# Patient Record
Sex: Female | Born: 1960 | Race: Black or African American | Hispanic: No | Marital: Married | Smoking: Never smoker
Health system: Southern US, Community
[De-identification: ages and names within clinical notes are randomized; demographics above are authoritative.]

## PROBLEM LIST (undated history)

## (undated) DIAGNOSIS — R42 Dizziness and giddiness: Secondary | ICD-10-CM

## (undated) DIAGNOSIS — M2550 Pain in unspecified joint: Secondary | ICD-10-CM

## (undated) DIAGNOSIS — Z889 Allergy status to unspecified drugs, medicaments and biological substances status: Secondary | ICD-10-CM

## (undated) DIAGNOSIS — K219 Gastro-esophageal reflux disease without esophagitis: Secondary | ICD-10-CM

## (undated) DIAGNOSIS — M1990 Unspecified osteoarthritis, unspecified site: Secondary | ICD-10-CM

## (undated) DIAGNOSIS — I10 Essential (primary) hypertension: Secondary | ICD-10-CM

## (undated) DIAGNOSIS — M2540 Effusion, unspecified joint: Secondary | ICD-10-CM

## (undated) DIAGNOSIS — G8929 Other chronic pain: Secondary | ICD-10-CM

## (undated) HISTORY — PX: ABDOMINAL HYSTERECTOMY: SHX81

---

## 2000-03-10 ENCOUNTER — Ambulatory Visit (HOSPITAL_COMMUNITY): Admission: RE | Admit: 2000-03-10 | Discharge: 2000-03-10 | Payer: Self-pay | Admitting: Family Medicine

## 2000-03-10 ENCOUNTER — Encounter: Payer: Self-pay | Admitting: Family Medicine

## 2000-07-20 ENCOUNTER — Encounter (INDEPENDENT_AMBULATORY_CARE_PROVIDER_SITE_OTHER): Payer: Self-pay | Admitting: Specialist

## 2000-07-20 ENCOUNTER — Other Ambulatory Visit: Admission: RE | Admit: 2000-07-20 | Discharge: 2000-07-20 | Payer: Self-pay | Admitting: Obstetrics and Gynecology

## 2000-07-23 ENCOUNTER — Observation Stay (HOSPITAL_COMMUNITY): Admission: RE | Admit: 2000-07-23 | Discharge: 2000-07-24 | Payer: Self-pay | Admitting: Obstetrics and Gynecology

## 2002-01-31 ENCOUNTER — Ambulatory Visit (HOSPITAL_COMMUNITY): Admission: RE | Admit: 2002-01-31 | Discharge: 2002-01-31 | Payer: Self-pay | Admitting: Obstetrics and Gynecology

## 2002-01-31 ENCOUNTER — Encounter: Payer: Self-pay | Admitting: Obstetrics and Gynecology

## 2003-02-08 ENCOUNTER — Ambulatory Visit (HOSPITAL_COMMUNITY): Admission: RE | Admit: 2003-02-08 | Discharge: 2003-02-08 | Payer: Self-pay | Admitting: Obstetrics and Gynecology

## 2003-02-08 ENCOUNTER — Encounter: Payer: Self-pay | Admitting: Obstetrics and Gynecology

## 2004-03-28 ENCOUNTER — Ambulatory Visit (HOSPITAL_COMMUNITY): Admission: RE | Admit: 2004-03-28 | Discharge: 2004-03-28 | Payer: Self-pay | Admitting: Infectious Diseases

## 2006-04-14 ENCOUNTER — Other Ambulatory Visit: Admission: RE | Admit: 2006-04-14 | Discharge: 2006-04-14 | Payer: Self-pay | Admitting: Obstetrics and Gynecology

## 2008-04-03 ENCOUNTER — Ambulatory Visit (HOSPITAL_COMMUNITY): Admission: RE | Admit: 2008-04-03 | Discharge: 2008-04-03 | Payer: Self-pay | Admitting: Obstetrics and Gynecology

## 2009-02-05 ENCOUNTER — Encounter (INDEPENDENT_AMBULATORY_CARE_PROVIDER_SITE_OTHER): Payer: Self-pay | Admitting: Internal Medicine

## 2009-02-05 ENCOUNTER — Observation Stay (HOSPITAL_COMMUNITY): Admission: EM | Admit: 2009-02-05 | Discharge: 2009-02-05 | Payer: Self-pay | Admitting: Emergency Medicine

## 2009-12-21 HISTORY — PX: FOOT SURGERY: SHX648

## 2010-01-28 ENCOUNTER — Ambulatory Visit (HOSPITAL_COMMUNITY): Admission: RE | Admit: 2010-01-28 | Discharge: 2010-01-28 | Payer: Self-pay | Admitting: Obstetrics and Gynecology

## 2011-04-07 LAB — CARDIAC PANEL(CRET KIN+CKTOT+MB+TROPI)
CK, MB: 0.9 ng/mL (ref 0.3–4.0)
Relative Index: 0.8 (ref 0.0–2.5)
Total CK: 106 U/L (ref 7–177)
Troponin I: 0.02 ng/mL (ref 0.00–0.06)

## 2011-04-07 LAB — CK TOTAL AND CKMB (NOT AT ARMC): Total CK: 108 U/L (ref 7–177)

## 2011-04-07 LAB — CBC
HCT: 38.2 % (ref 36.0–46.0)
Hemoglobin: 13 g/dL (ref 12.0–15.0)
MCHC: 34.1 g/dL (ref 30.0–36.0)
MCHC: 34.5 g/dL (ref 30.0–36.0)
MCV: 88.7 fL (ref 78.0–100.0)
MCV: 89.3 fL (ref 78.0–100.0)
Platelets: 171 10*3/uL (ref 150–400)
Platelets: 172 10*3/uL (ref 150–400)
RBC: 4.16 MIL/uL (ref 3.87–5.11)
RDW: 13.6 % (ref 11.5–15.5)
RDW: 13.9 % (ref 11.5–15.5)

## 2011-04-07 LAB — LIPID PANEL
Cholesterol: 183 mg/dL (ref 0–200)
HDL: 50 mg/dL (ref >39)
LDL Cholesterol: 126 mg/dL — ABNORMAL HIGH (ref 0–99)
Total CHOL/HDL Ratio: 3.7 RATIO

## 2011-04-07 LAB — POCT I-STAT, CHEM 8
Calcium, Ion: 1.25 mmol/L (ref 1.12–1.32)
Glucose, Bld: 139 mg/dL — ABNORMAL HIGH (ref 70–99)
HCT: 42 % (ref 36.0–46.0)
Hemoglobin: 14.3 g/dL (ref 12.0–15.0)
TCO2: 24 mmol/L (ref 0–100)

## 2011-04-07 LAB — HEMOGLOBIN A1C
Hgb A1c MFr Bld: 5.8 % (ref 4.6–6.1)
Mean Plasma Glucose: 120 mg/dL

## 2011-04-07 LAB — COMPREHENSIVE METABOLIC PANEL
ALT: 17 U/L (ref 0–35)
Alkaline Phosphatase: 62 U/L (ref 39–117)
BUN: 9 mg/dL (ref 6–23)
CO2: 24 mEq/L (ref 19–32)
Calcium: 9.6 mg/dL (ref 8.4–10.5)
GFR calc non Af Amer: >60 mL/min (ref >60)
Glucose, Bld: 102 mg/dL — ABNORMAL HIGH (ref 70–99)
Sodium: 137 mEq/L (ref 135–145)

## 2011-04-07 LAB — URINALYSIS, ROUTINE W REFLEX MICROSCOPIC
Glucose, UA: NEGATIVE mg/dL
Hgb urine dipstick: NEGATIVE
Specific Gravity, Urine: 1.006 (ref 1.005–1.030)

## 2011-04-07 LAB — POCT CARDIAC MARKERS

## 2011-04-07 LAB — PROTIME-INR: INR: 1 (ref 0.00–1.49)

## 2011-05-05 NOTE — H&P (Signed)
Kathryn Contreras, Kathryn Contreras NO.:  [id number]   MEDICAL RECORD NO.:  [id number]          PATIENT TYPE:  OBV   LOCATION:  4731                         FACILITY:  [facility]   PHYSICIAN:  Michiel Cowboy, MDDATE OF BIRTH:  23-Oct-1961   DATE OF ADMISSION:  02/05/2009  DATE OF DISCHARGE:                              HISTORY & PHYSICAL   PRIMARY CARE Malosi Hemstreet:  Holley Bouche, M.D.   CHIEF COMPLAINT:  Chest pain.   HISTORY OF PRESENT ILLNESS:  The patient is a 50 year old female with  past medical history significant for hypertension.  She presents with a  chief complaint of chest pain for about a week or so.  It has been  progressive in frequency.  It seems to be nonexertional; occurs at rest.  Sometimes feels burning, sometimes more like a pressure in the center of  her chest.  Today, she developed it while at rest with some nausea and  vomiting associated with it, as well as presyncopal sensations which  scared her enough to come to the emergency department.  She stated that  the chest pain may have gone on for a few hours.  There seems to be not  much relief with nitroglycerin.  She is interested in getting a stress  test done with a cardiologist tomorrow, but not sure if she can get an  appointment.  She does not have a cardiologist physician.   PAST MEDICAL HISTORY:  Hypertension.   SOCIAL HISTORY:  The patient does not smoke and never did.  Does not use  drugs.  Does not drink.  Has a husband who is caring.  Has a fairly  stressful job and endorses increased stress on her job recently.   FAMILY HISTORY:  Significant for father with prolapsed bowels status  post repair, but no history of coronary artery disease.   ALLERGIES:  No known drug allergies.   MEDICATIONS:  Bisoprolol/hydrochlorothiazide __________/6.25 mg daily.   PHYSICAL EXAMINATION:  VITAL SIGNS:  Temperature 98.2, blood pressure  167/84.  Per the patient, this has recently been elevated; usually  it is  much more under control.  Pulse 85, respirations 12, satting 100% on  room air.  GENERAL:  The patient appears to be in no acute distress.  HEENT:  Head nontraumatic.  Moist mucous membranes.  LUNGS:  Clear to auscultation bilaterally.  HEART:  Regular rate and rhythm.  No murmurs, rubs or gallops.  ABDOMEN:  Soft, nontender, nondistended.  LOWER EXTREMITIES:  Without clubbing, cyanosis or edema.  NEUROLOGIC:  The patient appears intact.   REVIEW OF SYSTEMS:  Negative except for per HPI.   LABORATORY DATA:  White blood cell count 10.9, hemoglobin 13.0,  creatinine 1.0, potassium 3.4, sodium 141.   ELECTROCARDIOGRAM:  EKG showing sinus tachycardia, heart rate at 101.  There is mild T-wave flattening in leads V2, V4 and V5 with T-wave  inversion in V3.  No old EKG available for comparison.   CHEST X-RAY:  No active cardiopulmonary disease.   ASSESSMENT/PLAN:  This is a 50 year old female with chest pain with  atypical features being admitted for rule  out.  1. Chest pain.  We will cycle cardiac enzymes, check fasting lipid      panel, hemoglobin A1c.  Check TSH.  Will do serial EKGs.  Since the      patient had persistent pain for about a week, she will need a      stress test.  Consider calling cardiology to have them arrange that      in the near future.  We will also get a GI cocktail to see if that      helps with her chest pain that sometimes has a burning component to      it, and make sure she is on Protonix.  2. Hypertension.  Will continue bisoprolol, increase      hydrochlorothiazide to 25 mg as she is still having continuous      hypertension and see how she does.  3. Hypopotassemia.  Will replace.  4. Prophylaxis.  Lovenox plus Protonix.      Michiel Cowboy, MD  Electronically Signed     AVD/MEDQ  D:  02/05/2009  T:  02/05/2009  Job:  [id number]   cc:   Holley Bouche, M.D.

## 2011-05-08 NOTE — H&P (Signed)
[facility]  Patient:    Kathryn Contreras, Kathryn Contreras                       MRN: [id number] Adm. Date:  07/23/00 Attending:  Janine Limbo, M.D. CC:         Arvella Merles, M.D.                         History and Physical  HISTORY OF PRESENT ILLNESS:       [name] is a 50 year old female, para 3-0-0-3, who presents for a vaginal hysterectomy because of fibroids, menorrhagia, and dysmenorrhea.  An endometrial biopsy showed benign endometrium.  Her last Pap smear was in December 2000, and it was within normal limits.  An ultrasound was performed which showed an 11 cm x 7.1 cm uterus, and fibroids were noted with the largest measuring 4.7 cm.  The ovaries appeared normal.  The patient is currently taking birth control pills, and this has not relieved her discomfort.  She is ready to proceed with definitive therapy.  DRUG ALLERGIES:                   None.  PAST MEDICAL HISTORY:             The patient has a history of hypertension, and she is currently taking Ziac 5 mg each day in addition to her Loestrin 120 each day.  REVIEW OF SYSTEMS:                Noncontributory.  FAMILY HISTORY:                   The patient has a family history of hypertension in both her parents, and she had a paternal aunt who had asthma and breast cancer.  SOCIAL HISTORY:                   The patient is married, and she works for [employer].  She denies cigarette use.  PHYSICAL EXAMINATION:  VITAL SIGNS:                      Blood pressure 118/84.  On her last visit, weight is 200 pounds.  HEENT:                            Within normal limits.  CHEST:                            Clear.  HEART:                            Regular rate and rhythm.  BREASTS:                          Without masses.  ABDOMEN:                          Nontender.  EXTREMITIES:                      Within normal limits.  NEUROLOGIC:                       Normal  exam.  PELVIC:                           External genitalia is normal.  Vagina is normal except for pelvic relaxation.  The cervix is nontender.  The uterus is 8 to 10-week size, irregular, and nontender.  Adnexa: No masses.  Rectovaginal exam confirms.  ASSESSMENT:                       1. Fibroid uterus.                                   2. Menorrhagia.                                   3. Dysmenorrhea.  PLAN:                             The patient will undergo a vaginal hysterectomy.  She understands the indications for her procedure as well as the alternative treatment.  She accepts the risks of, but not limited to, anesthetic complications, bleeding, infection, and possible damage to the surrounding organs. DD:  07/22/00 TD:  07/22/00 Job: [id number]

## 2011-05-08 NOTE — Op Note (Signed)
[facility]  Patient:    Kathryn Contreras, Kathryn Contreras                     MRN: [id number] Proc. Date: 07/23/00 Adm. Date:  [id number] Attending:  Leonard Schwartz CC:         Arvella Merles, M.D.                           Operative Report  PREOPERATIVE DIAGNOSES:           1. 10-week size fibroid uterus.                                   2. Menorrhagia.                                   3. Dysmenorrhea.                                   4. Anemia (hemoglobin 11.7).  POSTOPERATIVE DIAGNOSES:          1. 10-week size fibroid uterus.                                   2. Menorrhagia.                                   3. Dysmenorrhea.                                   4. Anemia (hemoglobin 11.7).  OPERATION:                        Vaginal hysterectomy.  SURGEON:                          Janine Limbo, M.D.  ASSISTANT:                        Belva Bertin, PA-C.  ANESTHESIA:                       General.  DISPOSITION:                      The patient is a 50 year old female with the above-mentioned diagnoses.  She understands the indications for her procedure, and she accepts the risks of, but not limited to, anesthetic complications, bleeding, infection, and possible damage to the surrounding organs.  FINDINGS:                         A 10-12 week size fibroid uterus was present.  The fallopian tubes and ovaries appeared normal.  DESCRIPTION OF PROCEDURE:         The patient was taken to the operating room, where a general anesthetic was given.  The patients lower abdomen, perineum and vagina were prepped with multiple layers of Betadine.  A  Foley catheter was placed into the bladder.  The patient was sterilely draped.  Examination under anesthesia was performed.  The cervix was injected with a diluted solution of ______ and saline.                                    A circumferential incision was made around the cervix and the anterior, then  posterior cul-de-sac was sharply entered. Alternating from right to left, the uterosacral ligament; paracervical tissues; parametrial tissues; uterine arteries; and upper pedicles were clamped, cut, suture tie-off, and tied securely.  The uterus was elevated through the posterior corpotomy.  The remainder of the upper pedicles were clamped and cut; the uterus was removed from the operative field.                                    The upper pedicles were secured using three ties, and then suture ligatures.  Hemostasis was achieved using figure-of-eight sutures.  The sutures attached to the uterosacral ligaments were brought out through the vaginal angle, then tied securely. Hemacolcotoplasty suture was placed in the posterior cul-de-sac, incorporating the uterosacral ligaments and the posterior peritoneal.  A final check was made for hemostasis, and hemostasis was noted to be adequate.  The vaginal cuff was closed using figure-of-eight sutures.  Sponge, needle and instrument counts were correct on two occasions.  ESTIMATED BLOOD LOSS:             250 cc.  FINAL DISPOSITION:                The patient tolerated the procedure well. Suture material used throughout was 0 Vicryl.  There was 900 cc of urine output during the procedure; urine was clear.  The patient was awakened from her anesthetic and taken to the recovery room in stable condition. DD:  07/23/00 TD:  07/26/00 Job: [id number]

## 2011-05-11 ENCOUNTER — Other Ambulatory Visit (HOSPITAL_COMMUNITY): Payer: Self-pay | Admitting: Obstetrics and Gynecology

## 2011-05-11 DIAGNOSIS — Z1231 Encounter for screening mammogram for malignant neoplasm of breast: Secondary | ICD-10-CM

## 2011-05-26 ENCOUNTER — Ambulatory Visit (HOSPITAL_COMMUNITY)
Admission: RE | Admit: 2011-05-26 | Discharge: 2011-05-26 | Disposition: A | Discharge status: Home / Self Care | Payer: 59 | Source: Ambulatory Visit | Attending: Obstetrics and Gynecology | Admitting: Obstetrics and Gynecology

## 2011-05-26 DIAGNOSIS — Z1231 Encounter for screening mammogram for malignant neoplasm of breast: Secondary | ICD-10-CM

## 2011-06-09 ENCOUNTER — Ambulatory Visit (HOSPITAL_COMMUNITY)
Admission: RE | Admit: 2011-06-09 | Discharge: 2011-06-09 | Disposition: A | Discharge status: Home / Self Care | Payer: 59 | Source: Ambulatory Visit | Attending: Obstetrics and Gynecology | Admitting: Obstetrics and Gynecology

## 2011-06-09 DIAGNOSIS — Z1231 Encounter for screening mammogram for malignant neoplasm of breast: Secondary | ICD-10-CM | POA: Insufficient documentation

## 2012-03-01 ENCOUNTER — Other Ambulatory Visit: Payer: Self-pay | Admitting: Family Medicine

## 2012-03-01 DIAGNOSIS — M545 Low back pain: Secondary | ICD-10-CM

## 2012-03-06 ENCOUNTER — Ambulatory Visit
Admission: RE | Admit: 2012-03-06 | Discharge: 2012-03-06 | Disposition: A | Discharge status: Home / Self Care | Payer: 59 | Source: Ambulatory Visit | Attending: Family Medicine | Admitting: Family Medicine

## 2012-03-06 DIAGNOSIS — M545 Low back pain: Secondary | ICD-10-CM

## 2012-03-22 ENCOUNTER — Other Ambulatory Visit: Payer: Self-pay | Admitting: Neurosurgery

## 2012-04-01 ENCOUNTER — Encounter (HOSPITAL_COMMUNITY): Payer: Self-pay | Admitting: Pharmacy Technician

## 2012-04-06 ENCOUNTER — Encounter (HOSPITAL_COMMUNITY): Payer: Self-pay

## 2012-04-06 ENCOUNTER — Encounter (HOSPITAL_COMMUNITY)
Admission: RE | Admit: 2012-04-06 | Discharge: 2012-04-06 | Disposition: A | Discharge status: Home / Self Care | Payer: 59 | Source: Ambulatory Visit | Attending: Anesthesiology | Admitting: Anesthesiology

## 2012-04-06 ENCOUNTER — Encounter (HOSPITAL_COMMUNITY)
Admission: RE | Admit: 2012-04-06 | Discharge: 2012-04-06 | Disposition: A | Discharge status: Home / Self Care | Payer: 59 | Source: Ambulatory Visit | Attending: Neurosurgery | Admitting: Neurosurgery

## 2012-04-06 HISTORY — DX: Other chronic pain: G89.29

## 2012-04-06 HISTORY — DX: Effusion, unspecified joint: M25.40

## 2012-04-06 HISTORY — DX: Dizziness and giddiness: R42

## 2012-04-06 HISTORY — DX: Allergy status to unspecified drugs, medicaments and biological substances: Z88.9

## 2012-04-06 HISTORY — DX: Essential (primary) hypertension: I10

## 2012-04-06 HISTORY — DX: Gastro-esophageal reflux disease without esophagitis: K21.9

## 2012-04-06 HISTORY — DX: Unspecified osteoarthritis, unspecified site: M19.90

## 2012-04-06 HISTORY — DX: Pain in unspecified joint: M25.50

## 2012-04-06 LAB — CBC
MCH: 29.6 pg (ref 26.0–34.0)
MCHC: 34.2 g/dL (ref 30.0–36.0)
RDW: 14 % (ref 11.5–15.5)

## 2012-04-06 LAB — SURGICAL PCR SCREEN
MRSA, PCR: NEGATIVE
Staphylococcus aureus: NEGATIVE

## 2012-04-06 LAB — BASIC METABOLIC PANEL
BUN: 13 mg/dL (ref 6–23)
Creatinine, Ser: 0.88 mg/dL (ref 0.50–1.10)
GFR calc Af Amer: 87 mL/min — ABNORMAL LOW (ref >90)
GFR calc non Af Amer: 75 mL/min — ABNORMAL LOW (ref >90)
Glucose, Bld: 82 mg/dL (ref 70–99)

## 2012-04-06 NOTE — Progress Notes (Addendum)
Pt doesn't have a cardiologist  Echo in epic from 2010 Stress test done in 2010-to request report from [facility]  Denies heart cath  Medical MD is Dr.William Tiburcio Pea with [facility] on [facility] HTN

## 2012-04-06 NOTE — Pre-Procedure Instructions (Signed)
20 Kathryn Contreras  04/06/2012   Your procedure is scheduled on:  Thurs, April 25 @ 0730  Report to [facility] at 0530 AM.  Call this number if you have problems the morning of surgery: [phone]   Remember:   Do not eat food:After Midnight.  May have clear liquids: up to 4 Hours before arrival.(until 1:30 am)  Clear liquids include soda, tea, black coffee, apple or grape juice, broth,water  Take these medicines the morning of surgery with A SIP OF WATER: Pain Pill(if needed)   Do not wear jewelry, make-up or nail polish.  Do not wear lotions, powders, or perfumes.   Do not shave 48 hours prior to surgery.  Do not bring valuables to the hospital.  Contacts, dentures or bridgework may not be worn into surgery.  Leave suitcase in the car. After surgery it may be brought to your room.  For patients admitted to the hospital, checkout time is 11:00 AM the day of discharge.   Patients discharged the day of surgery will not be allowed to drive home.  Special Instructions: CHG Shower Use Special Wash: 1/2 bottle night before surgery and 1/2 bottle morning of surgery.   Please read over the following fact sheets that you were given: Pain Booklet, Coughing and Deep Breathing, MRSA Information and Surgical Site Infection Prevention

## 2012-04-07 NOTE — Consult Note (Signed)
Anesthesia Chart Review:  Patient is a 51 year old female scheduled for right L4 laminectomy on 04/14/12.  History includes non-smoker, HTN, GERD, dizziness, chronic pain, obesity with BMI 35.  PCP is Dr. Johny Blamer [facility]).  Labs and CXR acceptable.  Echo from 02/05/09 showed: - Overall left ventricular systolic function was normal. Left ventricular ejection fraction was estimated to be 60 %. There were no left ventricular regional wall motion abnormalities. Left ventricular diastolic function parameters were normal. - The mean transaortic valve gradient was 6 mmHg.  She had a stress test at [facility] on 02/08/09 (for "atypicl angina" that showed normal perfusion, EF 74%.  It was felt to be a low risk scan.  No specific follow-up was recommended unless she had recurrent symptoms.  Her EKG then showed SR, poor r wave progression, consider old anterior infarct, non-specific T wave abnormality.  EKG from 04/06/12 showed NSR with sinus arrhythmia, cannot rule out anterior infarct (age undetermined).  No CV symptoms were documented from her PAT visit.  If remains asymptomatic, then anticipate she can proceed as planned.

## 2012-04-13 MED ORDER — CEFAZOLIN SODIUM-DEXTROSE 2-3 GM-% IV SOLR
2.0000 g | INTRAVENOUS | Status: AC
  Administered 2012-04-14: 2 g via INTRAVENOUS
  Filled 2012-04-13: qty 50

## 2012-04-14 ENCOUNTER — Encounter (HOSPITAL_COMMUNITY): Payer: Self-pay | Admitting: *Deleted

## 2012-04-14 ENCOUNTER — Observation Stay (HOSPITAL_COMMUNITY)
Admission: RE | Admit: 2012-04-14 | Discharge: 2012-04-15 | Disposition: A | Discharge status: Home / Self Care | Payer: 59 | Source: Ambulatory Visit | Attending: Neurosurgery | Admitting: Neurosurgery

## 2012-04-14 ENCOUNTER — Encounter (HOSPITAL_COMMUNITY)
Admission: RE | Disposition: A | Discharge status: Home / Self Care | Payer: Self-pay | Source: Ambulatory Visit | Attending: Neurosurgery

## 2012-04-14 ENCOUNTER — Observation Stay (HOSPITAL_COMMUNITY): Payer: 59

## 2012-04-14 ENCOUNTER — Encounter (HOSPITAL_COMMUNITY): Payer: Self-pay | Admitting: Anesthesiology

## 2012-04-14 ENCOUNTER — Encounter (HOSPITAL_COMMUNITY): Payer: Self-pay | Admitting: Vascular Surgery

## 2012-04-14 ENCOUNTER — Ambulatory Visit (HOSPITAL_COMMUNITY): Payer: 59 | Admitting: Vascular Surgery

## 2012-04-14 DIAGNOSIS — Z01818 Encounter for other preprocedural examination: Secondary | ICD-10-CM | POA: Insufficient documentation

## 2012-04-14 DIAGNOSIS — M7130 Other bursal cyst, unspecified site: Principal | ICD-10-CM | POA: Insufficient documentation

## 2012-04-14 DIAGNOSIS — I10 Essential (primary) hypertension: Secondary | ICD-10-CM | POA: Insufficient documentation

## 2012-04-14 DIAGNOSIS — M47817 Spondylosis without myelopathy or radiculopathy, lumbosacral region: Secondary | ICD-10-CM | POA: Insufficient documentation

## 2012-04-14 DIAGNOSIS — M7138 Other bursal cyst, other site: Secondary | ICD-10-CM

## 2012-04-14 DIAGNOSIS — K219 Gastro-esophageal reflux disease without esophagitis: Secondary | ICD-10-CM | POA: Insufficient documentation

## 2012-04-14 DIAGNOSIS — Z01812 Encounter for preprocedural laboratory examination: Secondary | ICD-10-CM | POA: Insufficient documentation

## 2012-04-14 DIAGNOSIS — Z01810 Encounter for preprocedural cardiovascular examination: Secondary | ICD-10-CM | POA: Insufficient documentation

## 2012-04-14 HISTORY — PX: LUMBAR LAMINECTOMY/DECOMPRESSION MICRODISCECTOMY: SHX5026

## 2012-04-14 SURGERY — LUMBAR LAMINECTOMY/DECOMPRESSION MICRODISCECTOMY 1 LEVEL
Anesthesia: General | Site: Spine Lumbar | Laterality: Right | Wound class: Clean

## 2012-04-14 MED ORDER — HYDROMORPHONE HCL PF 1 MG/ML IJ SOLN
0.2500 mg | INTRAMUSCULAR | Status: DC | PRN
  Administered 2012-04-14: 0.5 mg via INTRAVENOUS
  Administered 2012-04-14 (×2): 0.25 mg via INTRAVENOUS

## 2012-04-14 MED ORDER — BACITRACIN ZINC 500 UNIT/GM EX OINT
TOPICAL_OINTMENT | CUTANEOUS | Status: DC | PRN
  Administered 2012-04-14: 1 via TOPICAL

## 2012-04-14 MED ORDER — BUPIVACAINE-EPINEPHRINE PF 0.5-1:200000 % IJ SOLN
INTRAMUSCULAR | Status: DC | PRN
  Administered 2012-04-14: 10 mL

## 2012-04-14 MED ORDER — ONDANSETRON HCL 4 MG/2ML IJ SOLN
INTRAMUSCULAR | Status: DC | PRN
  Administered 2012-04-14: 4 mg via INTRAVENOUS

## 2012-04-14 MED ORDER — ZOLPIDEM TARTRATE 5 MG PO TABS: 10.0000 mg | ORAL_TABLET | Freq: Every evening | ORAL | Status: DC | PRN

## 2012-04-14 MED ORDER — LOSARTAN POTASSIUM 50 MG PO TABS
100.0000 mg | ORAL_TABLET | Freq: Every day | ORAL | Status: DC
  Administered 2012-04-14 – 2012-04-15 (×2): 100 mg via ORAL
  Filled 2012-04-14 (×2): qty 2

## 2012-04-14 MED ORDER — MIDAZOLAM HCL 5 MG/5ML IJ SOLN
INTRAMUSCULAR | Status: DC | PRN
  Administered 2012-04-14: 2 mg via INTRAVENOUS

## 2012-04-14 MED ORDER — BUPIVACAINE LIPOSOME 1.3 % IJ SUSP
20.0000 mL | INTRAMUSCULAR | Status: AC
Start: 2012-04-14 — End: 2012-04-15
  Filled 2012-04-14: qty 20

## 2012-04-14 MED ORDER — FENTANYL CITRATE 0.05 MG/ML IJ SOLN
INTRAMUSCULAR | Status: DC | PRN
  Administered 2012-04-14 (×2): 100 ug via INTRAVENOUS
  Administered 2012-04-14: 50 ug via INTRAVENOUS

## 2012-04-14 MED ORDER — ONDANSETRON HCL 4 MG/2ML IJ SOLN: 4.0000 mg | INTRAMUSCULAR | Status: DC | PRN

## 2012-04-14 MED ORDER — SODIUM CHLORIDE 0.9 % IV SOLN
INTRAVENOUS | Status: AC
  Filled 2012-04-14: qty 500

## 2012-04-14 MED ORDER — 0.9 % SODIUM CHLORIDE (POUR BTL) OPTIME
TOPICAL | Status: DC | PRN
  Administered 2012-04-14: 1000 mL

## 2012-04-14 MED ORDER — CEFAZOLIN SODIUM-DEXTROSE 2-3 GM-% IV SOLR
2.0000 g | Freq: Three times a day (TID) | INTRAVENOUS | Status: AC
  Administered 2012-04-14 – 2012-04-15 (×2): 2 g via INTRAVENOUS
  Filled 2012-04-14 (×2): qty 50

## 2012-04-14 MED ORDER — HYDROMORPHONE HCL PF 1 MG/ML IJ SOLN
INTRAMUSCULAR | Status: AC
  Filled 2012-04-14: qty 1

## 2012-04-14 MED ORDER — HYDROCODONE-ACETAMINOPHEN 5-325 MG PO TABS: 1.0000 | ORAL_TABLET | ORAL | Status: DC | PRN

## 2012-04-14 MED ORDER — MORPHINE SULFATE 4 MG/ML IJ SOLN: 0.0500 mg/kg | INTRAMUSCULAR | Status: DC | PRN

## 2012-04-14 MED ORDER — PHENOL 1.4 % MT LIQD: 1.0000 | OROMUCOSAL | Status: DC | PRN

## 2012-04-14 MED ORDER — THROMBIN 5000 UNITS EX KIT
PACK | CUTANEOUS | Status: DC | PRN
  Administered 2012-04-14 (×2): 5000 [IU] via TOPICAL

## 2012-04-14 MED ORDER — DEXAMETHASONE SODIUM PHOSPHATE 4 MG/ML IJ SOLN
INTRAMUSCULAR | Status: DC | PRN
  Administered 2012-04-14: 8 mg via INTRAVENOUS

## 2012-04-14 MED ORDER — GLYCOPYRROLATE 0.2 MG/ML IJ SOLN
INTRAMUSCULAR | Status: DC | PRN
  Administered 2012-04-14: 0.6 mg via INTRAVENOUS

## 2012-04-14 MED ORDER — MORPHINE SULFATE 4 MG/ML IJ SOLN
1.0000 mg | INTRAMUSCULAR | Status: DC | PRN
  Administered 2012-04-14: 4 mg via INTRAVENOUS
  Filled 2012-04-14: qty 1

## 2012-04-14 MED ORDER — PROPOFOL 10 MG/ML IV EMUL
INTRAVENOUS | Status: DC | PRN
  Administered 2012-04-14: 250 mg via INTRAVENOUS

## 2012-04-14 MED ORDER — SODIUM CHLORIDE 0.9 % IR SOLN
Status: DC | PRN
  Administered 2012-04-14: 09:00:00

## 2012-04-14 MED ORDER — ONDANSETRON HCL 4 MG/2ML IJ SOLN: 4.0000 mg | Freq: Once | INTRAMUSCULAR | Status: DC | PRN

## 2012-04-14 MED ORDER — HEMOSTATIC AGENTS (NO CHARGE) OPTIME
TOPICAL | Status: DC | PRN
  Administered 2012-04-14: 1 via TOPICAL

## 2012-04-14 MED ORDER — LIDOCAINE HCL (CARDIAC) 20 MG/ML IV SOLN
INTRAVENOUS | Status: DC | PRN
  Administered 2012-04-14: 100 mg via INTRAVENOUS

## 2012-04-14 MED ORDER — DIAZEPAM 5 MG PO TABS
5.0000 mg | ORAL_TABLET | Freq: Four times a day (QID) | ORAL | Status: DC | PRN
  Administered 2012-04-14 – 2012-04-15 (×3): 5 mg via ORAL
  Filled 2012-04-14 (×3): qty 1

## 2012-04-14 MED ORDER — LOSARTAN POTASSIUM-HCTZ 100-12.5 MG PO TABS: 1.0000 | ORAL_TABLET | Freq: Every day | ORAL | Status: DC

## 2012-04-14 MED ORDER — ACETAMINOPHEN 650 MG RE SUPP: 650.0000 mg | RECTAL | Status: DC | PRN

## 2012-04-14 MED ORDER — LACTATED RINGERS IV SOLN
INTRAVENOUS | Status: DC | PRN
  Administered 2012-04-14 (×2): via INTRAVENOUS

## 2012-04-14 MED ORDER — MORPHINE SULFATE 2 MG/ML IJ SOLN: 0.0500 mg/kg | INTRAMUSCULAR | Status: DC | PRN

## 2012-04-14 MED ORDER — BACITRACIN 50000 UNITS IM SOLR
INTRAMUSCULAR | Status: AC
  Filled 2012-04-14: qty 1

## 2012-04-14 MED ORDER — ADULT MULTIVITAMIN W/MINERALS CH
1.0000 | ORAL_TABLET | Freq: Every day | ORAL | Status: DC
  Administered 2012-04-14 – 2012-04-15 (×2): 1 via ORAL
  Filled 2012-04-14 (×2): qty 1

## 2012-04-14 MED ORDER — ETODOLAC 400 MG PO TABS
400.0000 mg | ORAL_TABLET | Freq: Two times a day (BID) | ORAL | Status: DC | PRN
  Administered 2012-04-14: 400 mg via ORAL
  Filled 2012-04-14 (×2): qty 1

## 2012-04-14 MED ORDER — LACTATED RINGERS IV SOLN: INTRAVENOUS | Status: DC

## 2012-04-14 MED ORDER — HYDROCHLOROTHIAZIDE 12.5 MG PO CAPS
12.5000 mg | ORAL_CAPSULE | Freq: Every day | ORAL | Status: DC
  Administered 2012-04-14 – 2012-04-15 (×2): 12.5 mg via ORAL
  Filled 2012-04-14 (×2): qty 1

## 2012-04-14 MED ORDER — OXYCODONE-ACETAMINOPHEN 5-325 MG PO TABS
1.0000 | ORAL_TABLET | ORAL | Status: DC | PRN
  Administered 2012-04-14: 2 via ORAL
  Administered 2012-04-14: 1 via ORAL
  Administered 2012-04-15: 2 via ORAL
  Filled 2012-04-14: qty 1
  Filled 2012-04-14 (×2): qty 2

## 2012-04-14 MED ORDER — NEOSTIGMINE METHYLSULFATE 1 MG/ML IJ SOLN
INTRAMUSCULAR | Status: DC | PRN
  Administered 2012-04-14: 4 mg via INTRAVENOUS

## 2012-04-14 MED ORDER — ROCURONIUM BROMIDE 100 MG/10ML IV SOLN
INTRAVENOUS | Status: DC | PRN
  Administered 2012-04-14: 50 mg via INTRAVENOUS

## 2012-04-14 MED ORDER — MENTHOL 3 MG MT LOZG: 1.0000 | LOZENGE | OROMUCOSAL | Status: DC | PRN

## 2012-04-14 MED ORDER — ACETAMINOPHEN 10 MG/ML IV SOLN
INTRAVENOUS | Status: DC | PRN
  Administered 2012-04-14: 1000 mg via INTRAVENOUS

## 2012-04-14 MED ORDER — ACETAMINOPHEN 10 MG/ML IV SOLN
INTRAVENOUS | Status: AC
  Filled 2012-04-14: qty 100

## 2012-04-14 MED ORDER — ACETAMINOPHEN 325 MG PO TABS: 650.0000 mg | ORAL_TABLET | ORAL | Status: DC | PRN

## 2012-04-14 MED ORDER — DOCUSATE SODIUM 100 MG PO CAPS
100.0000 mg | ORAL_CAPSULE | Freq: Two times a day (BID) | ORAL | Status: DC
  Administered 2012-04-14 – 2012-04-15 (×2): 100 mg via ORAL
  Filled 2012-04-14 (×2): qty 1

## 2012-04-14 MED ORDER — DEXTROSE 5 % IV SOLN
INTRAVENOUS | Status: DC | PRN
  Administered 2012-04-14 (×2): via INTRAVENOUS

## 2012-04-14 SURGICAL SUPPLY — 56 items
BAG DECANTER FOR FLEXI CONT (MISCELLANEOUS) ×2 IMPLANT
BENZOIN TINCTURE PRP APPL 2/3 (GAUZE/BANDAGES/DRESSINGS) ×2 IMPLANT
BLADE SURG ROTATE 9660 (MISCELLANEOUS) IMPLANT
BRUSH SCRUB EZ PLAIN DRY (MISCELLANEOUS) ×2 IMPLANT
BUR ACORN 6.0 (BURR) ×2 IMPLANT
BUR MATCHSTICK NEURO 3.0 LAGG (BURR) ×2 IMPLANT
CANISTER SUCTION 2500CC (MISCELLANEOUS) ×2 IMPLANT
CLOTH BEACON ORANGE TIMEOUT ST (SAFETY) ×2 IMPLANT
CONT SPEC 4OZ CLIKSEAL STRL BL (MISCELLANEOUS) ×4 IMPLANT
DRAPE LAPAROTOMY 100X72X124 (DRAPES) ×2 IMPLANT
DRAPE MICROSCOPE LEICA (MISCELLANEOUS) IMPLANT
DRAPE MICROSCOPE ZEISS OPMI (DRAPES) ×2 IMPLANT
DRAPE POUCH INSTRU U-SHP 10X18 (DRAPES) ×2 IMPLANT
DRAPE SURG 17X23 STRL (DRAPES) ×8 IMPLANT
ELECT BLADE 4.0 EZ CLEAN MEGAD (MISCELLANEOUS) ×2
ELECT REM PT RETURN 9FT ADLT (ELECTROSURGICAL) ×2
ELECTRODE BLDE 4.0 EZ CLN MEGD (MISCELLANEOUS) ×1 IMPLANT
ELECTRODE REM PT RTRN 9FT ADLT (ELECTROSURGICAL) ×1 IMPLANT
GAUZE SPONGE 4X4 16PLY XRAY LF (GAUZE/BANDAGES/DRESSINGS) IMPLANT
GLOVE BIO SURGEON STRL SZ8.5 (GLOVE) ×2 IMPLANT
GLOVE BIOGEL PI IND STRL 6.5 (GLOVE) ×1 IMPLANT
GLOVE BIOGEL PI IND STRL 8 (GLOVE) ×2 IMPLANT
GLOVE BIOGEL PI INDICATOR 6.5 (GLOVE) ×1
GLOVE BIOGEL PI INDICATOR 8 (GLOVE) ×2
GLOVE ECLIPSE 7.5 STRL STRAW (GLOVE) ×8 IMPLANT
GLOVE EXAM NITRILE LRG STRL (GLOVE) IMPLANT
GLOVE EXAM NITRILE MD LF STRL (GLOVE) ×2 IMPLANT
GLOVE EXAM NITRILE XL STR (GLOVE) IMPLANT
GLOVE EXAM NITRILE XS STR PU (GLOVE) IMPLANT
GLOVE SS BIOGEL STRL SZ 8 (GLOVE) ×1 IMPLANT
GLOVE SUPERSENSE BIOGEL SZ 8 (GLOVE) ×1
GLOVE SURG SS PI 6.5 STRL IVOR (GLOVE) ×2 IMPLANT
GOWN BRE IMP SLV AUR LG STRL (GOWN DISPOSABLE) ×4 IMPLANT
GOWN BRE IMP SLV AUR XL STRL (GOWN DISPOSABLE) ×2 IMPLANT
GOWN STRL REIN 2XL LVL4 (GOWN DISPOSABLE) ×2 IMPLANT
KIT BASIN OR (CUSTOM PROCEDURE TRAY) ×2 IMPLANT
KIT ROOM TURNOVER OR (KITS) ×2 IMPLANT
NEEDLE HYPO 21X1.5 SAFETY (NEEDLE) ×2 IMPLANT
NEEDLE HYPO 22GX1.5 SAFETY (NEEDLE) ×2 IMPLANT
NS IRRIG 1000ML POUR BTL (IV SOLUTION) ×2 IMPLANT
PACK LAMINECTOMY NEURO (CUSTOM PROCEDURE TRAY) ×2 IMPLANT
PAD ARMBOARD 7.5X6 YLW CONV (MISCELLANEOUS) ×10 IMPLANT
PATTIES SURGICAL .5 X1 (DISPOSABLE) ×2 IMPLANT
RUBBERBAND STERILE (MISCELLANEOUS) ×4 IMPLANT
SPONGE GAUZE 4X4 12PLY (GAUZE/BANDAGES/DRESSINGS) ×2 IMPLANT
SPONGE SURGIFOAM ABS GEL SZ50 (HEMOSTASIS) ×2 IMPLANT
STRIP CLOSURE SKIN 1/2X4 (GAUZE/BANDAGES/DRESSINGS) ×2 IMPLANT
SUT VIC AB 1 CT1 18XBRD ANBCTR (SUTURE) ×1 IMPLANT
SUT VIC AB 1 CT1 8-18 (SUTURE) ×1
SUT VIC AB 2-0 CP2 18 (SUTURE) ×2 IMPLANT
SYR 20CC LL (SYRINGE) ×4 IMPLANT
SYR 20ML ECCENTRIC (SYRINGE) ×2 IMPLANT
TAPE CLOTH SURG 4X10 WHT LF (GAUZE/BANDAGES/DRESSINGS) ×2 IMPLANT
TOWEL OR 17X24 6PK STRL BLUE (TOWEL DISPOSABLE) ×2 IMPLANT
TOWEL OR 17X26 10 PK STRL BLUE (TOWEL DISPOSABLE) ×2 IMPLANT
WATER STERILE IRR 1000ML POUR (IV SOLUTION) ×2 IMPLANT

## 2012-04-14 NOTE — Transfer of Care (Signed)
Immediate Anesthesia Transfer of Care Note  Patient: Kathryn Contreras  Procedure(s) Performed: Procedure(s) (LRB): LUMBAR LAMINECTOMY/DECOMPRESSION MICRODISCECTOMY 1 LEVEL (Right)  Patient Location: PACU  Anesthesia Type: General  Level of Consciousness: awake, alert , oriented and patient cooperative  Airway & Oxygen Therapy: Patient Spontanous Breathing  Post-op Assessment: Report given to PACU RN and Post -op Vital signs reviewed and stable  Post vital signs: Reviewed and stable  Complications: No apparent anesthesia complications

## 2012-04-14 NOTE — Op Note (Signed)
Brief history: The patient is a 51 year old black female who has suffered from back and right leg pain consistent with a lumbar radiculopathy. She has failed medical management and was worked up with a lumbar MRI. This demonstrated a synovial cyst at L4-5 on the right. I discussed the various treatment options with the patient including surgery. The patient has weighed the risks, benefits, and alternatives surgery and decided proceed with a right L4-5 laminectomy for removal of synovial cyst.  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  Surgeon: Dr. Delma Officer  Assistant: Dr. Aliene Beams  Anesthesia: Gen. endotracheal  Estimated blood loss: 25 cc  Specimens: None  Drains: None  Complications: None  Description of procedure: The patient was brought to the operating room by the anesthesia team. General endotracheal anesthesia was induced. The patient was then turned to the prone position on the Wilson frame. Her lumbosacral region was then prepared with Betadine scrub and Betadine solution. Sterile drapes were applied. I then injected the area to be incised with Marcaine with epinephrine solution. I then used a scalpel to make a linear midline incision over the L4-5 interspace. I used electrocautery to perform a right sided subperiosteal dissection exposing the spinous process and lamina of L4 and L5. We then obtained intraoperative radiograph to confirm our location. I then inserted the [facility] retractor for exposure.  We then brought the operative microscope into the field and under its medication and illumination we completed the microdissection/decompression. I used a high-speed drill to perform a right L4 laminotomy. I completed the right L4 hemilaminectomy using  the Kerrison punches. Her remove the L3-4 and L4-5 ligamentum flavum. I used microdissection to free up the thecal sac and the L4 and L5 nerve roots from the epidural tissue. We did encounter a synovial cyst at L4-5 the right as expected. We used microdissection to free up the nerve root from the dura. I then removed the synovial cyst using the Kerrison punches, decompressing the right L4 and L5 nerve roots. I then inspected the L4-5 intervertebral disc. There was no disc herniations. I then palpated along the ventral surface of the thecal sac and along exit route of the right L4 and L5 nerve roots and noted there were well decompressed. I obtained hemostasis using bipolar electrocautery. We then irrigated the wound out with bacitracin solution. Then removed the retractor and reapproximated patient's thoracolumbar fascia with interrupted #1 Vicryl suture. We reapproximated the subcutaneous tissue therapy tubercle suture. And the skin with Steri-Strips and benzoin. We then coated the wound with bacitracin ointment. A sterile dressing was applied the drapes were removed and the patient was subtotally accident by the anesthesia team. She was transported to the post anesthesia care unit in stable condition all sponge instrument and needle counts were correct at the end this case.

## 2012-04-14 NOTE — Anesthesia Procedure Notes (Signed)
Procedure Name: Intubation Date/Time: 04/14/2012 7:46 AM Performed by: Tyrone Nine Pre-anesthesia Checklist: Patient identified, Emergency Drugs available, Suction available, Patient being monitored and Timeout performed Patient Re-evaluated:Patient Re-evaluated prior to inductionOxygen Delivery Method: Circle system utilized Preoxygenation: Pre-oxygenation with 100% oxygen Intubation Type: IV induction Ventilation: Mask ventilation without difficulty Laryngoscope Size: Mac and 3 Grade View: Grade II Tube type: Oral Tube size: 8.0 mm Number of attempts: 1 Airway Equipment and Method: Stylet Placement Confirmation: positive ETCO2,  ETT inserted through vocal cords under direct vision,  breath sounds checked- equal and bilateral and CO2 detector Secured at: 22 cm Tube secured with: Tape Dental Injury: Teeth and Oropharynx as per pre-operative assessment

## 2012-04-14 NOTE — Anesthesia Postprocedure Evaluation (Signed)
  Anesthesia Post-op Note  Patient: Kathryn Contreras  Procedure(s) Performed: Procedure(s) (LRB): LUMBAR LAMINECTOMY/DECOMPRESSION MICRODISCECTOMY 1 LEVEL (Right)  Patient Location: PACU  Anesthesia Type: General  Level of Consciousness: awake, alert  and oriented  Airway and Oxygen Therapy: Patient Spontanous Breathing and Patient connected to nasal cannula oxygen  Post-op Pain: mild  Post-op Assessment: Post-op Vital signs reviewed, Patient's Cardiovascular Status Stable, Respiratory Function Stable, Patent Airway, No signs of Nausea or vomiting and Pain level controlled  Post-op Vital Signs: Reviewed and stable  Complications: No apparent anesthesia complications

## 2012-04-14 NOTE — Anesthesia Preprocedure Evaluation (Addendum)
Anesthesia Evaluation  Patient identified by MRN, date of birth, ID band Patient awake    Reviewed: Allergy & Precautions, H&P , NPO status , Patient's Chart, lab work & pertinent test results, reviewed documented beta blocker date and time   Airway Mallampati: I TM Distance: >3 FB Neck ROM: Full    Dental  (+) Teeth Intact and Dental Advisory Given   Pulmonary neg pulmonary ROS,  breath sounds clear to auscultation  Pulmonary exam normal       Cardiovascular hypertension, Pt. on medications Rhythm:Regular Rate:Normal     Neuro/Psych negative neurological ROS  negative psych ROS   GI/Hepatic GERD-  Controlled and Medicated,  Endo/Other  negative endocrine ROS  Renal/GU      Musculoskeletal negative musculoskeletal ROS (+)   Abdominal   Peds  Hematology negative hematology ROS (+)   Anesthesia Other Findings   Reproductive/Obstetrics negative OB ROS                         Anesthesia Physical Anesthesia Plan  ASA: II  Anesthesia Plan: General   Post-op Pain Management:    Induction: Intravenous  Airway Management Planned: Oral ETT  Additional Equipment:   Intra-op Plan:   Post-operative Plan: Extubation in OR  Informed Consent: I have reviewed the patients History and Physical, chart, labs and discussed the procedure including the risks, benefits and alternatives for the proposed anesthesia with the patient or authorized representative who has indicated his/her understanding and acceptance.   Dental advisory given  Plan Discussed with: CRNA, Anesthesiologist and Surgeon  Anesthesia Plan Comments:         Anesthesia Quick Evaluation

## 2012-04-14 NOTE — H&P (Signed)
Subjective: The patient is a 51 year old black female who has had chronic right leg pain. This has gotten progressively worse. The patient has failed medical management. She was worked up with a lumbar MRI which demonstrated the patient had a cyst at L4-5. I discussed the various treatment options with the patient including surgery. She has weighed the risks, benefits, and alternatives surgery will to proceed with a L4 laminectomy to remove the synovial cyst.   Past Medical History  Diagnosis Date  . Hypertension     takes Hyzaar daily  . Hx of seasonal allergies   . Dizziness     occasionally   . Arthritis   . Joint pain   . Joint swelling   . Chronic pain   . GERD (gastroesophageal reflux disease)     takes Zegerid prn  . Hemorrhoids   . Constipation     related to taking pain meds  . Urinary incontinence     Past Surgical History  Procedure Date  . Foot surgery 2011    bil d/t plantar fascitis and removal of bones in small toe  . Abdominal hysterectomy early 2000's    No Known Allergies  History  Substance Use Topics  . Smoking status: Never Smoker   . Smokeless tobacco: Not on file  . Alcohol Use: No    Family History  Problem Relation Age of Onset  . Anesthesia problems Neg Hx   . Hypotension Neg Hx   . Malignant hyperthermia Neg Hx   . Pseudochol deficiency Neg Hx    Prior to Admission medications   Medication Sig Start Date End Date Taking? Authorizing Provider  cholecalciferol (VITAMIN D) 1000 UNITS tablet Take 2,000 Units by mouth daily.   Yes Historical Provider, MD  etodolac (LODINE) 400 MG tablet Take 400 mg by mouth 2 (two) times daily as needed. For pain   Yes Historical Provider, MD  Flax Oil-Fish Oil-Borage Oil (FISH OIL-FLAX OIL-BORAGE OIL) CAPS Take 1 capsule by mouth 2 (two) times daily.   Yes Historical Provider, MD  losartan-hydrochlorothiazide (HYZAAR) 100-12.5 MG per tablet Take 1 tablet by mouth daily.   Yes Historical Provider, MD  Multiple  Vitamin (MULITIVITAMIN WITH MINERALS) TABS Take 1 tablet by mouth daily.   Yes Historical Provider, MD  oxyCODONE-acetaminophen (PERCOCET) 5-325 MG per tablet Take 1 tablet by mouth every 4 (four) hours as needed. For pain   Yes Historical Provider, MD  vitamin E 400 UNIT capsule Take 400 Units by mouth 2 (two) times daily.   Yes Historical Provider, MD     Review of Systems  Positive ROS: As above  All other systems have been reviewed and were otherwise negative with the exception of those mentioned in the HPI and as above.  Objective: Vital signs in last 24 hours: Temp:  [98.5 F (36.9 C)] 98.5 F (36.9 C) (04/25 0606) Resp:  [18] 18  (04/25 0606) BP: (121)/(82) 121/82 mmHg (04/25 0606) SpO2:  [98 %] 98 % (04/25 0606)  General Appearance: Alert, cooperative, no distress, appears stated age Head: Normocephalic, without obvious abnormality, atraumatic Eyes: PERRL, conjunctiva/corneas clear, EOM's intact, fundi benign, both eyes      Ears: Normal TM's and external ear canals, both ears Throat: Lips, mucosa, and tongue normal; teeth and gums normal Neck: Supple, symmetrical, trachea midline, no adenopathy; thyroid: No enlargement/tenderness/nodules; no carotid bruit or JVD Back: Symmetric, no curvature, ROM normal, no CVA tenderness Lungs: Clear to auscultation bilaterally, respirations unlabored Heart: Regular rate and rhythm, S1  and S2 normal, no murmur, rub or gallop Abdomen: Soft, non-tender, bowel sounds active all four quadrants, no masses, no organomegaly Extremities: Extremities normal, atraumatic, no cyanosis or edema Pulses: 2+ and symmetric all extremities Skin: Skin color, texture, turgor normal, no rashes or lesions  NEUROLOGIC:   Mental status: alert and oriented, no aphasia, good attention span, Fund of knowledge/ memory ok Motor Exam - grossly normal Sensory Exam - grossly normal Reflexes:  Coordination - grossly normal Gait - grossly normal Balance - grossly  normal Cranial Nerves: I: smell Not tested  II: visual acuity  OS: Normal    OD: Normal   II: visual fields Full to confrontation  II: pupils Equal, round, reactive to light  III,VII: ptosis None  III,IV,VI: extraocular muscles  Full ROM  V: mastication Normal  V: facial light touch sensation  Normal  V,VII: corneal reflex  Present  VII: facial muscle function - upper  Normal  VII: facial muscle function - lower Normal  VIII: hearing Not tested  IX: soft palate elevation  Normal  IX,X: gag reflex Present  XI: trapezius strength  5/5  XI: sternocleidomastoid strength 5/5  XI: neck flexion strength  5/5  XII: tongue strength  Normal    Data Review Lab Results  Component Value Date   WBC 8.0 04/06/2012   HGB 12.3 04/06/2012   HCT 36.0 04/06/2012   MCV 86.7 04/06/2012   PLT 284 04/06/2012   Lab Results  Component Value Date   NA 137 04/06/2012   K 4.2 04/06/2012   CL 100 04/06/2012   CO2 30 04/06/2012   BUN 13 04/06/2012   CREATININE 0.88 04/06/2012   GLUCOSE 82 04/06/2012   Lab Results  Component Value Date   INR 1.0 02/05/2009    Assessment/Plan: 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 I reviewed her MR scan with her and pointed out the abnormalities. We have discussed the various treatment options including surgery. I described the surgical option of a right L4 laminectomy to remove the stone pulses. I've shown her surgical models. We have discussed the risks, benefits, alternatives and likelihood of achieving our goals with surgery. I have answered all the patient's questions. She has decided to proceed with surgery.   Ladarryl Wrage D 04/14/2012 7:27 AM

## 2012-04-14 NOTE — Progress Notes (Signed)
Pt. Refuses blood products. Refusal of blood products sheet signed by pt. And faxed to blood bank.

## 2012-04-14 NOTE — Preoperative (Signed)
Beta Blockers   Reason not to administer Beta Blockers:Not Applicable 

## 2012-04-14 NOTE — Progress Notes (Signed)
Patient ID: Kathryn Contreras, female   DOB: 1961/04/03, 51 y.o.   MRN: [id number] Subjective:  The patient is alert and pleasant. Her back is approximately sore.  Objective: Vital signs in last 24 hours: Temp:  [97.5 F (36.4 C)-98.5 F (36.9 C)] 97.5 F (36.4 C) (04/25 1200) Pulse Rate:  [61] 61  (04/25 1200) Resp:  [18] 18  (04/25 1200) BP: (121-140)/(82-85) 140/85 mmHg (04/25 1200) SpO2:  [98 %] 98 % (04/25 1200)  Intake/Output from previous day:   Intake/Output this shift: Total I/O In: 1050 [I.V.:1050] Out: 50 [Blood:50]  Physical exam the patient is alert and oriented x3 she is moving all 4 extremities well.  Lab Results: No results found for this basename: WBC:2,HGB:2,HCT:2,PLT:2 in the last 72 hours BMET No results found for this basename: NA:2,K:2,CL:2,CO2:2,GLUCOSE:2,BUN:2,CREATININE:2,CALCIUM:2 in the last 72 hours  Studies/Results: No results found.  Assessment/Plan: Patient is doing well.  LOS: 0 days     Phala Schraeder D 04/14/2012, 1:08 PM

## 2012-04-14 NOTE — Progress Notes (Signed)
Patient ID: Kathryn Contreras, female   DOB: 31-May-1961, 51 y.o.   MRN: [id number] Subjective:  The patient is alert and pleasant. She looks and feels well.  Objective: Vital signs in last 24 hours: Temp:  [97.5 F (36.4 C)-98.5 F (36.9 C)] 97.5 F (36.4 C) (04/25 0930) Resp:  [18] 18  (04/25 0606) BP: (121)/(82) 121/82 mmHg (04/25 0606) SpO2:  [98 %] 98 % (04/25 0606)  Intake/Output from previous day:   Intake/Output this shift: Total I/O In: 1050 [I.V.:1050] Out: 50 [Blood:50]  Physical exam the patient is alert and oriented. Her strength is normal in her lower extremities.  Lab Results: No results found for this basename: WBC:2,HGB:2,HCT:2,PLT:2 in the last 72 hours BMET No results found for this basename: NA:2,K:2,CL:2,CO2:2,GLUCOSE:2,BUN:2,CREATININE:2,CALCIUM:2 in the last 72 hours  Studies/Results: No results found.  Assessment/Plan: The patient is doing well.  LOS: 0 days     Kathryn Contreras D 04/14/2012, 10:20 AM

## 2012-04-15 ENCOUNTER — Encounter (HOSPITAL_COMMUNITY): Payer: Self-pay | Admitting: Neurosurgery

## 2012-04-15 MED ORDER — OXYCODONE-ACETAMINOPHEN 5-325 MG PO TABS: 1.0000 | ORAL_TABLET | ORAL | Status: AC | PRN

## 2012-04-15 NOTE — Discharge Instructions (Signed)
Wound Care Keep incision covered and dry for one week. You may shower. You may remove outer bandage after one week.  Do not put any creams, lotions, or ointments on incision. Leave steri-strips on back.  They will fall off by themselves. Activity Walk each and every day, increasing distance each day. No lifting greater than 5 lbs.  Avoid bending, arching, and twisting. No driving or riding in car until further notice at follow up appointment. If provided with back brace, wear when out of bed.  It is not necessary to wear brace in bed. Diet Resume your normal diet.  Return to Work Will be discussed at you follow up appointment. Call Your Doctor If Any of These Occur Redness, drainage, or swelling at the wound.  Temperature greater than 101 degrees. Severe pain not relieved by pain medication. Incision starts to come apart. Follow Up Appt Call today for appointment in 1-2 weeks ([phone]) or for problems.  If you have any hardware placed in your spine, you will need an x-ray before your appointment.

## 2012-04-15 NOTE — Discharge Summary (Signed)
Physician Discharge Summary  Patient ID: Kathryn Contreras MRN: [id number] DOB/AGE: 1961-03-04 51 y.o.  Admit date: 04/14/2012 Discharge date: 04/15/2012  Admission Diagnoses:  Discharge Diagnoses:  Active Problems:  * No active hospital problems. *    Discharged Condition: good  Hospital Course: Surgery one day, home the next. No issues. Pain resolved  Consults: None  Significant Diagnostic Studies: none  Treatments: Lam for synovial cyst  Discharge Exam: Blood pressure 102/68, pulse 69, temperature 98.1 F (36.7 C), temperature source Oral, resp. rate 20, SpO2 97.00%. Incision/Wound:healing well   Disposition:   Discharge Orders    Future Orders Please Complete By Expires   Diet general      Discharge instructions      Comments:   Mostly bedrest. Get up 9 or 10 times each day and walk for 15-20 minutes each time. Very little sitting the first week. No riding in the car until your first post op appointment. If you had neck surgery...may shower from the chest down. If you had low back surgery....you may shower with a saran wrap covering over the incision. Take your pain medicine as needed...and other medicines that you are instructed to take. Call for an appointment...[phone].   Call MD for:  temperature >100.4      Call MD for:  persistant nausea and vomiting      Call MD for:  severe uncontrolled pain      Call MD for:  redness, tenderness, or signs of infection (pain, swelling, redness, odor or green/yellow discharge around incision site)      Call MD for:  difficulty breathing, headache or visual disturbances      Call MD for:  hives        Medication List  As of 04/15/2012  9:53 AM   TAKE these medications         cholecalciferol 1000 UNITS tablet   Commonly known as: VITAMIN D   Take 2,000 Units by mouth daily.      etodolac 400 MG tablet   Commonly known as: LODINE   Take 400 mg by mouth 2 (two) times daily as needed. For pain      Fish Oil-Flax  Oil-Borage Oil Caps   Take 1 capsule by mouth 2 (two) times daily.      losartan-hydrochlorothiazide 100-12.5 MG per tablet   Commonly known as: HYZAAR   Take 1 tablet by mouth daily.      mulitivitamin with minerals Tabs   Take 1 tablet by mouth daily.      oxyCODONE-acetaminophen 5-325 MG per tablet   Commonly known as: PERCOCET   Take 1 tablet by mouth every 4 (four) hours as needed. For pain      oxyCODONE-acetaminophen 5-325 MG per tablet   Commonly known as: PERCOCET   Take 1-2 tablets by mouth every 4 (four) hours as needed.      vitamin E 400 UNIT capsule   Take 400 Units by mouth 2 (two) times daily.             At home rest most of the time. Get up 9 or 10 times each day and take a 15 or 20 minute walk. No riding in the car and to your first postoperative appointment. If you have neck surgery you may shower from the chest down starting on the third postoperative day. If you had back surgery he may start showering on the third postoperative day with saran wrap wrapped around your incisional area 3 times.  After the shower remove the saran wrap. Take pain medicine as needed and other medications as instructed. Call my office for an appointment.  SignedReinaldo Meeker, MD 04/15/2012, 9:53 AM

## 2012-10-31 ENCOUNTER — Other Ambulatory Visit (HOSPITAL_COMMUNITY): Payer: Self-pay | Admitting: Family Medicine

## 2012-10-31 DIAGNOSIS — Z1231 Encounter for screening mammogram for malignant neoplasm of breast: Secondary | ICD-10-CM

## 2012-10-31 DIAGNOSIS — Z803 Family history of malignant neoplasm of breast: Secondary | ICD-10-CM

## 2012-11-15 ENCOUNTER — Ambulatory Visit (HOSPITAL_COMMUNITY): Admission: RE | Admit: 2012-11-15 | Payer: 59 | Source: Ambulatory Visit

## 2012-12-06 ENCOUNTER — Ambulatory Visit (HOSPITAL_COMMUNITY)
Admission: RE | Admit: 2012-12-06 | Discharge: 2012-12-06 | Disposition: A | Discharge status: Home / Self Care | Payer: 59 | Source: Ambulatory Visit | Attending: Family Medicine | Admitting: Family Medicine

## 2012-12-06 DIAGNOSIS — Z1231 Encounter for screening mammogram for malignant neoplasm of breast: Secondary | ICD-10-CM | POA: Insufficient documentation

## 2012-12-06 DIAGNOSIS — Z803 Family history of malignant neoplasm of breast: Secondary | ICD-10-CM

## 2013-07-18 ENCOUNTER — Other Ambulatory Visit: Payer: Self-pay | Admitting: Gastroenterology

## 2013-09-26 ENCOUNTER — Other Ambulatory Visit: Payer: Self-pay | Admitting: Orthopedic Surgery

## 2013-09-26 NOTE — H&P (Signed)
Kathryn Contreras  DOB: 07/19/1961  Married / Language: English / Race: Black or African American  Female   Date of Admission: 10/18/2013   Chief Complaint: Right Hip Pain   History of Present Illness  The patient is a 52 year old female who comes in for a preoperative History and Physical. The patient is scheduled for a right total hip arthroplasty to be performed by Dr. Frank V. Aluisio, MD at [facility] on 10/18/2013.  The patient is a 52 year old female who presents for follow up of their hip. The patient is being followed for their right hip pain and osteoarthritis. Symptoms reported today include: pain. and report their pain level to be mild to moderate. The following medication has been used for pain control: none. The patient presents today following 3 weeks post intraarticular hip injection w/ Ramos. Note for "Follow-up Hip": Injection helped initially, but pain has started to come back the past couple of days.  The cortisone injection helped, but it is already wearing off. Pain is in her groin radiating to her knee. She was told for a long time that the problem was in her back, but then she was evaluated further with x rays of her hip showing arthritis. She was referred to us after that. Unfortunately, things are all getting progressively worse. The hip hurts at all times. She is having a hard time sleeping at night. It is definitely limiting what she can and cannot do. She is at a stage where she is ready to get this fixed.  They have been treated conservatively in the past for the above stated problem and despite conservative measures, they continue to have progressive pain and severe functional limitations and dysfunction. They have failed non-operative management including home exercise, medications, and injections. It is felt that they would benefit from undergoing total joint replacement. Risks and benefits of the procedure have been discussed with the patient and they elect  to proceed with surgery. There are no active contraindications to surgery such as ongoing infection or rapidly progressive neurological disease.   Problem List  Osteoarthritis, Hip (715.35)   Allergies  No Known Drug Allergies. 06/26/2013   Family History  Cancer. father  Diabetes Mellitus. mother  First Degree Relatives. reported  Hypertension. mother and father  Osteoarthritis. mother and grandmother mothers side  Father. Living. age 75  Mother. Living. age 73   Social History  Illicit drug use. no  Living situation. live with spouse  Marital status. married  Exercise. does running / walking  Current work status. working full time  Drug/Alcohol Rehab (Currently). no  Drug/Alcohol Rehab (Previously). no  Tobacco use. never smoker  Tobacco / smoke exposure. no  Never smoker  Number of flights of stairs before winded. 2-3  Pain Contract. yes  Alcohol use. current drinker; drinks beer, wine and hard liquor; only occasionally per week  Children. 4  Advance Directives. Healthcare POA  Post-Surgical Plans. Plan is for skilled rehab.   Medication History  Losartan Potassium-HCTZ (100-12.5MG Tablet, Oral) Active.  Multivitamins ( Oral) Active.  Zegerid ( Oral) Specific dose unknown - Active.   Past Surgical History  Foot Surgery. bilateral  Hysterectomy. Date: 2001. partial (non-cancerous)  Spinal Surgery. Date: 03/2012.   Medical History  Osteoarthritis  Gastroesophageal Reflux Disease  High blood pressure  Hemorrhoids  Urinary Incontinence  Menopause   Review of Systems  General: Not Present- Chills, Fever, Night Sweats, Fatigue, Weight Gain, Weight Loss and Memory Loss.  Skin:   Not Present- Hives, Itching, Rash, Eczema and Lesions.  HEENT: Not Present- Tinnitus, Headache, Double Vision, Visual Loss, Hearing Loss and Dentures.  Respiratory: Not Present- Shortness of breath with exertion, Shortness of breath at rest, Allergies, Coughing up blood and Chronic Cough.   Cardiovascular: Not Present- Chest Pain, Racing/skipping heartbeats, Difficulty Breathing Lying Down, Murmur, Swelling and Palpitations.  Gastrointestinal: Not Present- Bloody Stool, Heartburn, Abdominal Pain, Vomiting, Nausea, Constipation, Diarrhea, Difficulty Swallowing, Jaundice and Loss of appetitie.  Female Genitourinary: Not Present- Blood in Urine, Urinary frequency, Weak urinary stream, Discharge, Flank Pain, Incontinence, Painful Urination, Urgency, Urinary Retention and Urinating at Night.  Musculoskeletal: Present- Joint Pain, Back Pain and Morning Stiffness. Not Present- Muscle Weakness, Muscle Pain, Joint Swelling and Spasms.  Neurological: Not Present- Tremor, Dizziness, Blackout spells, Paralysis, Difficulty with balance and Weakness.  Psychiatric: Not Present- Insomnia.   Vitals  Height: 66 in  Pulse: 64 (Regular) Resp.: 14 (Unlabored)  BP: 130/86 (Sitting, Right Arm, Standard)   Physical Exam  The physical exam findings are as follows:  General  Mental Status - Alert, cooperative and good historian. General Appearance - pleasant. Not in acute distress. Orientation - Oriented X3. Build & Nutrition - Well nourished and Well developed.  Head and Neck  Head - normocephalic, atraumatic .  Neck  Global Assessment - supple. no bruit auscultated on the right and no bruit auscultated on the left.  Eye  Vision - Wears corrective lenses. Pupil - Bilateral - Regular and Round.  Motion - Bilateral - EOMI.  Chest and Lung Exam  Auscultation:  Breath sounds: - clear at anterior chest wall and - clear at posterior chest wall.  Adventitious sounds: - No Adventitious sounds.  Cardiovascular  Auscultation: Rhythm - Regular rate and rhythm. Heart Sounds - S1 WNL and S2 WNL.  Murmurs & Other Heart Sounds: Auscultation of the heart reveals - No Murmurs.  Abdomen  Inspection: Contour - Generalized mild distention.  Palpation/Percussion: Tenderness - Abdomen is non-tender to palpation.  Rigidity (guarding) - Abdomen is soft.  Auscultation: Auscultation of the abdomen reveals - Bowel sounds normal.  Female Genitourinary  Not done, not pertinent to present illness  Musculoskeletal  On examination she is alert and oriented in no apparent distress. Her right hip can be flexed to 90 with no internal or external rotation. No abduction. Her knee exam is normal. The left hip is normal.   RADIOGRAPHS:  AP pelvis and lateral of the right hip reviewed from earlier this year showing severe bone on bone arthritis, almost ankylosis of the hip joint with large osteophytes.   Assessment & Plan  Osteoarthritis, Hip (715.35)   Impression: Right Hip   Note: Plan is for a Right Total Hip Replacement by Dr. Aluisio.   Plan is to go to Rehab. She wants to look into [facility].   PCP - Dr. William Harris   The patient does not have any contraindications and will receive TXA (tranexamic acid) prior to surgery.   Signed electronically by Alexzandrew L Perkins, III PA-C  

## 2013-10-05 ENCOUNTER — Other Ambulatory Visit: Payer: Self-pay | Admitting: Orthopedic Surgery

## 2013-10-10 ENCOUNTER — Encounter (HOSPITAL_COMMUNITY): Payer: Self-pay | Admitting: Pharmacy Technician

## 2013-10-13 ENCOUNTER — Encounter (HOSPITAL_COMMUNITY)
Admission: RE | Admit: 2013-10-13 | Discharge: 2013-10-13 | Disposition: A | Discharge status: Home / Self Care | Payer: 59 | Source: Ambulatory Visit | Attending: Orthopedic Surgery | Admitting: Orthopedic Surgery

## 2013-10-13 ENCOUNTER — Ambulatory Visit (HOSPITAL_COMMUNITY)
Admission: RE | Admit: 2013-10-13 | Discharge: 2013-10-13 | Disposition: A | Discharge status: Home / Self Care | Payer: 59 | Source: Ambulatory Visit | Attending: Orthopedic Surgery | Admitting: Orthopedic Surgery

## 2013-10-13 ENCOUNTER — Encounter (HOSPITAL_COMMUNITY): Payer: Self-pay

## 2013-10-13 DIAGNOSIS — Z01818 Encounter for other preprocedural examination: Secondary | ICD-10-CM | POA: Insufficient documentation

## 2013-10-13 DIAGNOSIS — M1610 Unilateral primary osteoarthritis, unspecified hip: Secondary | ICD-10-CM | POA: Insufficient documentation

## 2013-10-13 DIAGNOSIS — Z01812 Encounter for preprocedural laboratory examination: Secondary | ICD-10-CM | POA: Insufficient documentation

## 2013-10-13 DIAGNOSIS — M169 Osteoarthritis of hip, unspecified: Secondary | ICD-10-CM | POA: Insufficient documentation

## 2013-10-13 LAB — COMPREHENSIVE METABOLIC PANEL
AST: 21 U/L (ref 0–37)
CO2: 31 mEq/L (ref 19–32)
Calcium: 10 mg/dL (ref 8.4–10.5)
Creatinine, Ser: 0.88 mg/dL (ref 0.50–1.10)
GFR calc Af Amer: 86 mL/min — ABNORMAL LOW (ref >90)
GFR calc non Af Amer: 74 mL/min — ABNORMAL LOW (ref >90)
Total Protein: 7.1 g/dL (ref 6.0–8.3)

## 2013-10-13 LAB — CBC
HCT: 34 % — ABNORMAL LOW (ref 36.0–46.0)
MCH: 29.2 pg (ref 26.0–34.0)
MCHC: 33.8 g/dL (ref 30.0–36.0)
MCV: 86.3 fL (ref 78.0–100.0)
Platelets: 320 10*3/uL (ref 150–400)
RBC: 3.94 MIL/uL (ref 3.87–5.11)

## 2013-10-13 LAB — URINALYSIS, ROUTINE W REFLEX MICROSCOPIC
Glucose, UA: NEGATIVE mg/dL
Ketones, ur: NEGATIVE mg/dL
Leukocytes, UA: NEGATIVE
Nitrite: NEGATIVE
Protein, ur: NEGATIVE mg/dL
Specific Gravity, Urine: 1.008 (ref 1.005–1.030)
pH: 6.5 (ref 5.0–8.0)

## 2013-10-13 LAB — SURGICAL PCR SCREEN: MRSA, PCR: NEGATIVE

## 2013-10-13 LAB — PROTIME-INR: INR: 1.03 (ref 0.00–1.49)

## 2013-10-13 LAB — APTT: aPTT: 38 seconds — ABNORMAL HIGH (ref 24–37)

## 2013-10-13 NOTE — Pre-Procedure Instructions (Signed)
10-13-13 EKG 11'13 -report with chart. CXR / R hip Xray done today.

## 2013-10-13 NOTE — Patient Instructions (Addendum)
20 Kathryn Contreras  10/13/2013   Your procedure is scheduled on: 10-29  -2014  Report to [facility] at     0700   AM .  Call this number if you have problems the morning of surgery: [phone]  Or Presurgical Testing [phone](Aara Jacquot)      Do not eat food:After Midnight.    Take these medicines the morning of surgery with A SIP OF WATER: Zegerid. Tylenol. Hydrocodone if needed   Do not wear jewelry, make-up or nail polish.  Do not wear lotions, powders, or perfumes. You may wear deodorant.  Do not shave 12 hours prior to first CHG shower(legs and under arms).(face and neck okay.)  Do not bring valuables to the hospital.  Contacts, dentures or removable bridgework, body piercing, hair pins may not be worn into surgery.  Leave suitcase in the car. After surgery it may be brought to your room.  For patients admitted to the hospital, checkout time is 11:00 AM the day of discharge.   Patients discharged the day of surgery will not be allowed to drive home. Must have responsible person with you x 24 hours once discharged.  Name and phone number of your driver: Gary Bultman [phone] cell  Special Instructions: CHG(Chlorhedine 4%-"Hibiclens","Betasept","Aplicare") Shower Use Special Wash: see special instructions.(avoid face and genitals)   Please read over the following fact sheets that you were given: MRSA Information, Blood Transfusion fact sheet, Incentive Spirometry Instruction.    Failure to follow these instructions may result in Cancellation of your surgery.   Patient signature_______________________________________________________

## 2013-10-17 ENCOUNTER — Other Ambulatory Visit: Payer: Self-pay | Admitting: Orthopedic Surgery

## 2013-10-17 NOTE — H&P (Signed)
Kathryn Contreras  DOB: 11/16/1961  Married / Language: English / Race: Black or African American  Female   Date of Admission: 10/18/2013   Chief Complaint: Right Hip Pain   History of Present Illness  The patient is a 52 year old female who comes in for a preoperative History and Physical. The patient is scheduled for a right total hip arthroplasty to be performed by Dr. Frank V. Aluisio, MD at [facility] on 10/18/2013.  The patient is a 52 year old female who presents for follow up of their hip. The patient is being followed for their right hip pain and osteoarthritis. Symptoms reported today include: pain. and report their pain level to be mild to moderate. The following medication has been used for pain control: none. The patient presents today following 3 weeks post intraarticular hip injection w/ Ramos. Note for "Follow-up Hip": Injection helped initially, but pain has started to come back the past couple of days.  The cortisone injection helped, but it is already wearing off. Pain is in her groin radiating to her knee. She was told for a long time that the problem was in her back, but then she was evaluated further with x rays of her hip showing arthritis. She was referred to us after that. Unfortunately, things are all getting progressively worse. The hip hurts at all times. She is having a hard time sleeping at night. It is definitely limiting what she can and cannot do. She is at a stage where she is ready to get this fixed.  They have been treated conservatively in the past for the above stated problem and despite conservative measures, they continue to have progressive pain and severe functional limitations and dysfunction. They have failed non-operative management including home exercise, medications, and injections. It is felt that they would benefit from undergoing total joint replacement. Risks and benefits of the procedure have been discussed with the patient and they elect  to proceed with surgery. There are no active contraindications to surgery such as ongoing infection or rapidly progressive neurological disease.   Problem List  Osteoarthritis, Hip (715.35)   Allergies  No Known Drug Allergies. 06/26/2013   Family History  Cancer. father  Diabetes Mellitus. mother  First Degree Relatives. reported  Hypertension. mother and father  Osteoarthritis. mother and grandmother mothers side  Father. Living. age 75  Mother. Living. age 73   Social History  Illicit drug use. no  Living situation. live with spouse  Marital status. married  Exercise. does running / walking  Current work status. working full time  Drug/Alcohol Rehab (Currently). no  Drug/Alcohol Rehab (Previously). no  Tobacco use. never smoker  Tobacco / smoke exposure. no  Never smoker  Number of flights of stairs before winded. 2-3  Pain Contract. yes  Alcohol use. current drinker; drinks beer, wine and hard liquor; only occasionally per week  Children. 4  Advance Directives. Healthcare POA  Post-Surgical Plans. Plan is for skilled rehab.   Medication History  Losartan Potassium-HCTZ (100-12.5MG Tablet, Oral) Active.  Multivitamins ( Oral) Active.  Zegerid ( Oral) Specific dose unknown - Active.   Past Surgical History  Foot Surgery. bilateral  Hysterectomy. Date: 2001. partial (non-cancerous)  Spinal Surgery. Date: 03/2012.   Medical History  Osteoarthritis  Gastroesophageal Reflux Disease  High blood pressure  Hemorrhoids  Urinary Incontinence  Menopause   Review of Systems  General: Not Present- Chills, Fever, Night Sweats, Fatigue, Weight Gain, Weight Loss and Memory Loss.  Skin:   Not Present- Hives, Itching, Rash, Eczema and Lesions.  HEENT: Not Present- Tinnitus, Headache, Double Vision, Visual Loss, Hearing Loss and Dentures.  Respiratory: Not Present- Shortness of breath with exertion, Shortness of breath at rest, Allergies, Coughing up blood and Chronic Cough.   Cardiovascular: Not Present- Chest Pain, Racing/skipping heartbeats, Difficulty Breathing Lying Down, Murmur, Swelling and Palpitations.  Gastrointestinal: Not Present- Bloody Stool, Heartburn, Abdominal Pain, Vomiting, Nausea, Constipation, Diarrhea, Difficulty Swallowing, Jaundice and Loss of appetitie.  Female Genitourinary: Not Present- Blood in Urine, Urinary frequency, Weak urinary stream, Discharge, Flank Pain, Incontinence, Painful Urination, Urgency, Urinary Retention and Urinating at Night.  Musculoskeletal: Present- Joint Pain, Back Pain and Morning Stiffness. Not Present- Muscle Weakness, Muscle Pain, Joint Swelling and Spasms.  Neurological: Not Present- Tremor, Dizziness, Blackout spells, Paralysis, Difficulty with balance and Weakness.  Psychiatric: Not Present- Insomnia.   Vitals  Height: 66 in  Pulse: 64 (Regular) Resp.: 14 (Unlabored)  BP: 130/86 (Sitting, Right Arm, Standard)   Physical Exam  The physical exam findings are as follows:  General  Mental Status - Alert, cooperative and good historian. General Appearance - pleasant. Not in acute distress. Orientation - Oriented X3. Build & Nutrition - Well nourished and Well developed.  Head and Neck  Head - normocephalic, atraumatic .  Neck  Global Assessment - supple. no bruit auscultated on the right and no bruit auscultated on the left.  Eye  Vision - Wears corrective lenses. Pupil - Bilateral - Regular and Round.  Motion - Bilateral - EOMI.  Chest and Lung Exam  Auscultation:  Breath sounds: - clear at anterior chest wall and - clear at posterior chest wall.  Adventitious sounds: - No Adventitious sounds.  Cardiovascular  Auscultation: Rhythm - Regular rate and rhythm. Heart Sounds - S1 WNL and S2 WNL.  Murmurs & Other Heart Sounds: Auscultation of the heart reveals - No Murmurs.  Abdomen  Inspection: Contour - Generalized mild distention.  Palpation/Percussion: Tenderness - Abdomen is non-tender to palpation.  Rigidity (guarding) - Abdomen is soft.  Auscultation: Auscultation of the abdomen reveals - Bowel sounds normal.  Female Genitourinary  Not done, not pertinent to present illness  Musculoskeletal  On examination she is alert and oriented in no apparent distress. Her right hip can be flexed to 90 with no internal or external rotation. No abduction. Her knee exam is normal. The left hip is normal.   RADIOGRAPHS:  AP pelvis and lateral of the right hip reviewed from earlier this year showing severe bone on bone arthritis, almost ankylosis of the hip joint with large osteophytes.   Assessment & Plan  Osteoarthritis, Hip (715.35)   Impression: Right Hip   Note: Plan is for a Right Total Hip Replacement by Dr. Aluisio.   Plan is to go to Rehab. She wants to look into [facility].   PCP - Dr. William Harris   The patient does not have any contraindications and will receive TXA (tranexamic acid) prior to surgery.   Signed electronically by Talha Iser L Yahaira Bruski, III PA-C  

## 2013-10-18 ENCOUNTER — Encounter (HOSPITAL_COMMUNITY): Payer: 59 | Admitting: Anesthesiology

## 2013-10-18 ENCOUNTER — Inpatient Hospital Stay (HOSPITAL_COMMUNITY): Payer: 59

## 2013-10-18 ENCOUNTER — Inpatient Hospital Stay (HOSPITAL_COMMUNITY)
Admission: RE | Admit: 2013-10-18 | Discharge: 2013-10-21 | DRG: 470 | Disposition: A | Discharge status: Skilled Nursing Facility | Payer: 59 | Source: Ambulatory Visit | Attending: Orthopedic Surgery | Admitting: Orthopedic Surgery

## 2013-10-18 ENCOUNTER — Encounter (HOSPITAL_COMMUNITY)
Admission: RE | Disposition: A | Discharge status: Skilled Nursing Facility | Payer: Self-pay | Source: Ambulatory Visit | Attending: Orthopedic Surgery

## 2013-10-18 ENCOUNTER — Encounter (HOSPITAL_COMMUNITY): Payer: Self-pay

## 2013-10-18 ENCOUNTER — Ambulatory Visit (HOSPITAL_COMMUNITY): Payer: 59 | Admitting: Anesthesiology

## 2013-10-18 DIAGNOSIS — T84049A Periprosthetic fracture around unspecified internal prosthetic joint, initial encounter: Secondary | ICD-10-CM | POA: Diagnosis not present

## 2013-10-18 DIAGNOSIS — R32 Unspecified urinary incontinence: Secondary | ICD-10-CM | POA: Diagnosis present

## 2013-10-18 DIAGNOSIS — K219 Gastro-esophageal reflux disease without esophagitis: Secondary | ICD-10-CM | POA: Diagnosis present

## 2013-10-18 DIAGNOSIS — Y831 Surgical operation with implant of artificial internal device as the cause of abnormal reaction of the patient, or of later complication, without mention of misadventure at the time of the procedure: Secondary | ICD-10-CM | POA: Diagnosis not present

## 2013-10-18 DIAGNOSIS — Z6837 Body mass index (BMI) 37.0-37.9, adult: Secondary | ICD-10-CM

## 2013-10-18 DIAGNOSIS — M169 Osteoarthritis of hip, unspecified: Principal | ICD-10-CM | POA: Diagnosis present

## 2013-10-18 DIAGNOSIS — I10 Essential (primary) hypertension: Secondary | ICD-10-CM | POA: Diagnosis present

## 2013-10-18 DIAGNOSIS — M1610 Unilateral primary osteoarthritis, unspecified hip: Principal | ICD-10-CM | POA: Diagnosis present

## 2013-10-18 DIAGNOSIS — Z96649 Presence of unspecified artificial hip joint: Secondary | ICD-10-CM

## 2013-10-18 DIAGNOSIS — M9701XA Periprosthetic fracture around internal prosthetic right hip joint, initial encounter: Secondary | ICD-10-CM

## 2013-10-18 HISTORY — PX: TOTAL HIP ARTHROPLASTY: SHX124

## 2013-10-18 SURGERY — ARTHROPLASTY, HIP, TOTAL,POSTERIOR APPROACH
Anesthesia: General | Site: Hip | Laterality: Right | Wound class: Clean

## 2013-10-18 MED ORDER — PANTOPRAZOLE SODIUM 40 MG PO TBEC
40.0000 mg | DELAYED_RELEASE_TABLET | Freq: Every day | ORAL | Status: DC
  Filled 2013-10-18: qty 1

## 2013-10-18 MED ORDER — MENTHOL 3 MG MT LOZG
1.0000 | LOZENGE | OROMUCOSAL | Status: DC | PRN
  Administered 2013-10-19: 3 mg via ORAL
  Filled 2013-10-18: qty 9

## 2013-10-18 MED ORDER — ONDANSETRON HCL 4 MG/2ML IJ SOLN: 4.0000 mg | Freq: Four times a day (QID) | INTRAMUSCULAR | Status: DC | PRN

## 2013-10-18 MED ORDER — METOCLOPRAMIDE HCL 5 MG PO TABS
5.0000 mg | ORAL_TABLET | Freq: Three times a day (TID) | ORAL | Status: DC | PRN
  Filled 2013-10-18: qty 2

## 2013-10-18 MED ORDER — HYDROCHLOROTHIAZIDE 12.5 MG PO CAPS
12.5000 mg | ORAL_CAPSULE | Freq: Every evening | ORAL | Status: DC
  Administered 2013-10-18 – 2013-10-20 (×3): 12.5 mg via ORAL
  Filled 2013-10-18 (×4): qty 1

## 2013-10-18 MED ORDER — FLEET ENEMA 7-19 GM/118ML RE ENEM: 1.0000 | ENEMA | Freq: Once | RECTAL | Status: AC | PRN

## 2013-10-18 MED ORDER — FENTANYL CITRATE 0.05 MG/ML IJ SOLN
INTRAMUSCULAR | Status: DC | PRN
  Administered 2013-10-18: 100 ug via INTRAVENOUS
  Administered 2013-10-18 (×3): 50 ug via INTRAVENOUS

## 2013-10-18 MED ORDER — CEFAZOLIN SODIUM-DEXTROSE 2-3 GM-% IV SOLR
2.0000 g | Freq: Four times a day (QID) | INTRAVENOUS | Status: AC
  Administered 2013-10-18 (×2): 2 g via INTRAVENOUS
  Filled 2013-10-18 (×2): qty 50

## 2013-10-18 MED ORDER — SODIUM CHLORIDE 0.9 % IV SOLN
1000.0000 mg | INTRAVENOUS | Status: AC
  Administered 2013-10-18: 1000 mg via INTRAVENOUS
  Filled 2013-10-18 (×2): qty 10

## 2013-10-18 MED ORDER — BUPIVACAINE HCL (PF) 0.25 % IJ SOLN
INTRAMUSCULAR | Status: AC
  Filled 2013-10-18: qty 30

## 2013-10-18 MED ORDER — KCL IN DEXTROSE-NACL 20-5-0.45 MEQ/L-%-% IV SOLN
INTRAVENOUS | Status: AC
  Filled 2013-10-18: qty 1000

## 2013-10-18 MED ORDER — BISACODYL 10 MG RE SUPP: 10.0000 mg | Freq: Every day | RECTAL | Status: DC | PRN

## 2013-10-18 MED ORDER — LOSARTAN POTASSIUM-HCTZ 100-12.5 MG PO TABS: 1.0000 | ORAL_TABLET | Freq: Every evening | ORAL | Status: DC

## 2013-10-18 MED ORDER — OXYCODONE HCL 5 MG PO TABS
5.0000 mg | ORAL_TABLET | ORAL | Status: DC | PRN
  Administered 2013-10-18 – 2013-10-21 (×19): 10 mg via ORAL
  Filled 2013-10-18 (×18): qty 2

## 2013-10-18 MED ORDER — BUPIVACAINE HCL 0.25 % IJ SOLN
INTRAMUSCULAR | Status: DC | PRN
  Administered 2013-10-18: 30 mL

## 2013-10-18 MED ORDER — LOSARTAN POTASSIUM 50 MG PO TABS
100.0000 mg | ORAL_TABLET | Freq: Every evening | ORAL | Status: DC
  Administered 2013-10-18 – 2013-10-20 (×3): 100 mg via ORAL
  Filled 2013-10-18 (×4): qty 2

## 2013-10-18 MED ORDER — DEXAMETHASONE SODIUM PHOSPHATE 10 MG/ML IJ SOLN
10.0000 mg | Freq: Every day | INTRAMUSCULAR | Status: AC
  Filled 2013-10-18: qty 1

## 2013-10-18 MED ORDER — SODIUM CHLORIDE 0.9 % IV SOLN: INTRAVENOUS | Status: DC

## 2013-10-18 MED ORDER — METHOCARBAMOL 100 MG/ML IJ SOLN
500.0000 mg | Freq: Four times a day (QID) | INTRAVENOUS | Status: DC | PRN
  Administered 2013-10-18: 500 mg via INTRAVENOUS
  Filled 2013-10-18: qty 5

## 2013-10-18 MED ORDER — PROPOFOL 10 MG/ML IV BOLUS
INTRAVENOUS | Status: DC | PRN
  Administered 2013-10-18: 150 mg via INTRAVENOUS

## 2013-10-18 MED ORDER — HYDROMORPHONE HCL PF 1 MG/ML IJ SOLN
0.2500 mg | INTRAMUSCULAR | Status: DC | PRN
  Administered 2013-10-18 (×2): 0.5 mg via INTRAVENOUS

## 2013-10-18 MED ORDER — TRAMADOL HCL 50 MG PO TABS: 50.0000 mg | ORAL_TABLET | Freq: Four times a day (QID) | ORAL | Status: DC | PRN

## 2013-10-18 MED ORDER — ACETAMINOPHEN 10 MG/ML IV SOLN
1000.0000 mg | Freq: Once | INTRAVENOUS | Status: AC
  Administered 2013-10-18: 1000 mg via INTRAVENOUS
  Filled 2013-10-18: qty 100

## 2013-10-18 MED ORDER — 0.9 % SODIUM CHLORIDE (POUR BTL) OPTIME
TOPICAL | Status: DC | PRN
  Administered 2013-10-18: 1000 mL

## 2013-10-18 MED ORDER — ACETAMINOPHEN 500 MG PO TABS
1000.0000 mg | ORAL_TABLET | Freq: Four times a day (QID) | ORAL | Status: AC
  Administered 2013-10-18 – 2013-10-19 (×3): 1000 mg via ORAL
  Filled 2013-10-18 (×3): qty 2

## 2013-10-18 MED ORDER — SODIUM CHLORIDE 0.9 % IJ SOLN
INTRAMUSCULAR | Status: AC
  Filled 2013-10-18: qty 50

## 2013-10-18 MED ORDER — DOCUSATE SODIUM 100 MG PO CAPS
100.0000 mg | ORAL_CAPSULE | Freq: Two times a day (BID) | ORAL | Status: DC
  Administered 2013-10-18 – 2013-10-21 (×6): 100 mg via ORAL

## 2013-10-18 MED ORDER — CEFAZOLIN SODIUM-DEXTROSE 2-3 GM-% IV SOLR
INTRAVENOUS | Status: AC
Start: 2013-10-18 — End: 2013-10-18
  Filled 2013-10-18: qty 50

## 2013-10-18 MED ORDER — HYDROMORPHONE HCL PF 1 MG/ML IJ SOLN
INTRAMUSCULAR | Status: AC
  Filled 2013-10-18: qty 1

## 2013-10-18 MED ORDER — GLYCOPYRROLATE 0.2 MG/ML IJ SOLN
INTRAMUSCULAR | Status: DC | PRN
  Administered 2013-10-18: .5 mg via INTRAVENOUS

## 2013-10-18 MED ORDER — LACTATED RINGERS IV SOLN: INTRAVENOUS | Status: DC

## 2013-10-18 MED ORDER — KCL IN DEXTROSE-NACL 20-5-0.9 MEQ/L-%-% IV SOLN
INTRAVENOUS | Status: AC
  Filled 2013-10-18: qty 1000

## 2013-10-18 MED ORDER — DEXAMETHASONE 6 MG PO TABS
10.0000 mg | ORAL_TABLET | Freq: Every day | ORAL | Status: AC
  Administered 2013-10-19: 10:00:00 10 mg via ORAL
  Filled 2013-10-18: qty 1

## 2013-10-18 MED ORDER — ROCURONIUM BROMIDE 100 MG/10ML IV SOLN
INTRAVENOUS | Status: DC | PRN
  Administered 2013-10-18: 50 mg via INTRAVENOUS

## 2013-10-18 MED ORDER — METHOCARBAMOL 500 MG PO TABS
500.0000 mg | ORAL_TABLET | Freq: Four times a day (QID) | ORAL | Status: DC | PRN
  Administered 2013-10-18 – 2013-10-21 (×7): 500 mg via ORAL
  Filled 2013-10-18 (×7): qty 1

## 2013-10-18 MED ORDER — CHLORHEXIDINE GLUCONATE 4 % EX LIQD: 60.0000 mL | Freq: Once | CUTANEOUS | Status: DC

## 2013-10-18 MED ORDER — DIPHENHYDRAMINE HCL 12.5 MG/5ML PO ELIX: 12.5000 mg | ORAL_SOLUTION | ORAL | Status: DC | PRN

## 2013-10-18 MED ORDER — ONDANSETRON HCL 4 MG/2ML IJ SOLN
INTRAMUSCULAR | Status: DC | PRN
  Administered 2013-10-18: 4 mg via INTRAVENOUS

## 2013-10-18 MED ORDER — LIDOCAINE HCL (CARDIAC) 20 MG/ML IV SOLN
INTRAVENOUS | Status: DC | PRN
  Administered 2013-10-18: 50 mg via INTRAVENOUS

## 2013-10-18 MED ORDER — ACETAMINOPHEN 325 MG PO TABS: 650.0000 mg | ORAL_TABLET | Freq: Four times a day (QID) | ORAL | Status: DC | PRN

## 2013-10-18 MED ORDER — NEOSTIGMINE METHYLSULFATE 1 MG/ML IJ SOLN
INTRAMUSCULAR | Status: DC | PRN
  Administered 2013-10-18: 3.5 mg via INTRAVENOUS

## 2013-10-18 MED ORDER — KETOROLAC TROMETHAMINE 15 MG/ML IJ SOLN
7.5000 mg | Freq: Four times a day (QID) | INTRAMUSCULAR | Status: AC | PRN
  Administered 2013-10-18: 17:00:00 7.5 mg via INTRAVENOUS
  Filled 2013-10-18: qty 1

## 2013-10-18 MED ORDER — BUPIVACAINE LIPOSOME 1.3 % IJ SUSP
20.0000 mL | Freq: Once | INTRAMUSCULAR | Status: AC
  Administered 2013-10-18: 20 mL
  Filled 2013-10-18: qty 20

## 2013-10-18 MED ORDER — ACETAMINOPHEN 650 MG RE SUPP: 650.0000 mg | Freq: Four times a day (QID) | RECTAL | Status: DC | PRN

## 2013-10-18 MED ORDER — KCL IN DEXTROSE-NACL 20-5-0.9 MEQ/L-%-% IV SOLN
INTRAVENOUS | Status: DC
  Administered 2013-10-18: 14:00:00 via INTRAVENOUS
  Filled 2013-10-18 (×7): qty 1000

## 2013-10-18 MED ORDER — LACTATED RINGERS IV SOLN
INTRAVENOUS | Status: DC | PRN
  Administered 2013-10-18 (×2): via INTRAVENOUS

## 2013-10-18 MED ORDER — LACTATED RINGERS IV SOLN
INTRAVENOUS | Status: DC
  Administered 2013-10-18: 1000 mL via INTRAVENOUS

## 2013-10-18 MED ORDER — MORPHINE SULFATE 2 MG/ML IJ SOLN
1.0000 mg | INTRAMUSCULAR | Status: DC | PRN
  Administered 2013-10-18: 1 mg via INTRAVENOUS
  Filled 2013-10-18 (×2): qty 1

## 2013-10-18 MED ORDER — ONDANSETRON HCL 4 MG PO TABS: 4.0000 mg | ORAL_TABLET | Freq: Four times a day (QID) | ORAL | Status: DC | PRN

## 2013-10-18 MED ORDER — RIVAROXABAN 10 MG PO TABS
10.0000 mg | ORAL_TABLET | Freq: Every day | ORAL | Status: DC
  Administered 2013-10-19 – 2013-10-21 (×3): 10 mg via ORAL
  Filled 2013-10-18 (×4): qty 1

## 2013-10-18 MED ORDER — METOCLOPRAMIDE HCL 5 MG/ML IJ SOLN: 5.0000 mg | Freq: Three times a day (TID) | INTRAMUSCULAR | Status: DC | PRN

## 2013-10-18 MED ORDER — HYDROMORPHONE HCL PF 1 MG/ML IJ SOLN
INTRAMUSCULAR | Status: DC | PRN
  Administered 2013-10-18 (×2): 1 mg via INTRAVENOUS

## 2013-10-18 MED ORDER — POLYETHYLENE GLYCOL 3350 17 G PO PACK: 17.0000 g | PACK | Freq: Every day | ORAL | Status: DC | PRN

## 2013-10-18 MED ORDER — KETAMINE HCL 10 MG/ML IJ SOLN
INTRAMUSCULAR | Status: DC | PRN
  Administered 2013-10-18: 10 mg via INTRAVENOUS
  Administered 2013-10-18: 20 mg via INTRAVENOUS
  Administered 2013-10-18 (×2): 10 mg via INTRAVENOUS

## 2013-10-18 MED ORDER — CEFAZOLIN SODIUM-DEXTROSE 2-3 GM-% IV SOLR
2.0000 g | INTRAVENOUS | Status: AC
  Administered 2013-10-18: 2 g via INTRAVENOUS

## 2013-10-18 MED ORDER — MIDAZOLAM HCL 5 MG/5ML IJ SOLN
INTRAMUSCULAR | Status: DC | PRN
  Administered 2013-10-18 (×2): 1 mg via INTRAVENOUS

## 2013-10-18 MED ORDER — PHENOL 1.4 % MT LIQD: 1.0000 | OROMUCOSAL | Status: DC | PRN

## 2013-10-18 MED ORDER — DEXAMETHASONE SODIUM PHOSPHATE 10 MG/ML IJ SOLN
10.0000 mg | Freq: Once | INTRAMUSCULAR | Status: AC
  Administered 2013-10-18: 10 mg via INTRAVENOUS

## 2013-10-18 SURGICAL SUPPLY — 52 items
BAG ZIPLOCK 12X15 (MISCELLANEOUS) ×2 IMPLANT
BIT DRILL 2.8X128 (BIT) ×2 IMPLANT
BLADE EXTENDED COATED 6.5IN (ELECTRODE) ×2 IMPLANT
BLADE SAW SAG 73X25 THK (BLADE) ×1
BLADE SAW SGTL 73X25 THK (BLADE) ×1 IMPLANT
CAPT HIP PF COP ×2 IMPLANT
CLOTH BEACON ORANGE TIMEOUT ST (SAFETY) ×2 IMPLANT
DECANTER SPIKE VIAL GLASS SM (MISCELLANEOUS) ×2 IMPLANT
DRAPE INCISE IOBAN 66X45 STRL (DRAPES) ×2 IMPLANT
DRAPE ORTHO SPLIT 77X108 STRL (DRAPES) ×2
DRAPE POUCH INSTRU U-SHP 10X18 (DRAPES) ×2 IMPLANT
DRAPE SURG ORHT 6 SPLT 77X108 (DRAPES) ×2 IMPLANT
DRAPE U-SHAPE 47X51 STRL (DRAPES) ×2 IMPLANT
DRSG ADAPTIC 3X8 NADH LF (GAUZE/BANDAGES/DRESSINGS) ×2 IMPLANT
DRSG MEPILEX BORDER 4X4 (GAUZE/BANDAGES/DRESSINGS) ×2 IMPLANT
DRSG MEPILEX BORDER 4X8 (GAUZE/BANDAGES/DRESSINGS) ×2 IMPLANT
DURAPREP 26ML APPLICATOR (WOUND CARE) ×2 IMPLANT
ELECT REM PT RETURN 9FT ADLT (ELECTROSURGICAL) ×2
ELECTRODE REM PT RTRN 9FT ADLT (ELECTROSURGICAL) ×1 IMPLANT
EVACUATOR 1/8 PVC DRAIN (DRAIN) ×2 IMPLANT
FACESHIELD LNG OPTICON STERILE (SAFETY) ×8 IMPLANT
GLOVE BIO SURGEON STRL SZ7.5 (GLOVE) ×2 IMPLANT
GLOVE BIO SURGEON STRL SZ8 (GLOVE) ×2 IMPLANT
GLOVE BIOGEL PI IND STRL 8 (GLOVE) ×3 IMPLANT
GLOVE BIOGEL PI INDICATOR 8 (GLOVE) ×3
GLOVE SURG SS PI 6.5 STRL IVOR (GLOVE) ×4 IMPLANT
GOWN PREVENTION PLUS LG XLONG (DISPOSABLE) ×4 IMPLANT
GOWN STRL REIN XL XLG (GOWN DISPOSABLE) ×4 IMPLANT
IMMOBILIZER KNEE 20 (SOFTGOODS)
IMMOBILIZER KNEE 20 THIGH 36 (SOFTGOODS) IMPLANT
KIT BASIN OR (CUSTOM PROCEDURE TRAY) ×2 IMPLANT
MANIFOLD NEPTUNE II (INSTRUMENTS) ×2 IMPLANT
NDL SAFETY ECLIPSE 18X1.5 (NEEDLE) ×1 IMPLANT
NEEDLE HYPO 18GX1.5 SHARP (NEEDLE) ×1
NEEDLE HYPO 22GX1.5 SAFETY (NEEDLE) ×2 IMPLANT
NS IRRIG 1000ML POUR BTL (IV SOLUTION) ×2 IMPLANT
PACK TOTAL JOINT (CUSTOM PROCEDURE TRAY) ×2 IMPLANT
PASSER SUT SWANSON 36MM LOOP (INSTRUMENTS) ×2 IMPLANT
POSITIONER SURGICAL ARM (MISCELLANEOUS) ×2 IMPLANT
SPONGE GAUZE 4X4 12PLY (GAUZE/BANDAGES/DRESSINGS) ×2 IMPLANT
STRIP CLOSURE SKIN 1/2X4 (GAUZE/BANDAGES/DRESSINGS) ×2 IMPLANT
SUT ETHIBOND NAB CT1 #1 30IN (SUTURE) ×4 IMPLANT
SUT MNCRL AB 4-0 PS2 18 (SUTURE) ×2 IMPLANT
SUT VIC AB 2-0 CT1 27 (SUTURE) ×3
SUT VIC AB 2-0 CT1 TAPERPNT 27 (SUTURE) ×3 IMPLANT
SUT VLOC 180 0 24IN GS25 (SUTURE) ×4 IMPLANT
SYR 20CC LL (SYRINGE) ×2 IMPLANT
SYR 50ML LL SCALE MARK (SYRINGE) ×2 IMPLANT
TOWEL OR 17X26 10 PK STRL BLUE (TOWEL DISPOSABLE) ×4 IMPLANT
TOWEL OR NON WOVEN STRL DISP B (DISPOSABLE) ×2 IMPLANT
TRAY FOLEY CATH 14FRSI W/METER (CATHETERS) ×2 IMPLANT
WATER STERILE IRR 1500ML POUR (IV SOLUTION) ×4 IMPLANT

## 2013-10-18 NOTE — Transfer of Care (Signed)
Immediate Anesthesia Transfer of Care Note  Patient: Kathryn Contreras  Procedure(s) Performed: Procedure(s): RIGHT TOTAL HIP ARTHROPLASTY (Right)  Patient Location: PACU  Anesthesia Type:General  Level of Consciousness: awake, alert  and oriented  Airway & Oxygen Therapy: Patient Spontanous Breathing and Patient connected to face mask oxygen  Post-op Assessment: Report given to PACU RN and Post -op Vital signs reviewed and stable  Post vital signs: Reviewed and stable  Complications: No apparent anesthesia complications

## 2013-10-18 NOTE — Progress Notes (Signed)
I reviewed Kathryn Contreras's post-op film earlier this afternoon and noted a unicortical periprosthetic fracture about the tip of her femoral prosthesis. We subsequently obtained a lateral hip radiograph which did not show a fracture. This leads me to conclude that it is a unicortical disruption. We will treat this with TDWB precautions and do not currently need to treat this surgically as long as it remains non-displaced. I have discussed the situation with the patient and her family and they concur with the plan

## 2013-10-18 NOTE — Anesthesia Postprocedure Evaluation (Signed)
  Anesthesia Post-op Note  Patient: Kathryn Contreras  Procedure(s) Performed: Procedure(s) (LRB): RIGHT TOTAL HIP ARTHROPLASTY (Right)  Patient Location: PACU  Anesthesia Type: General  Level of Consciousness: awake and alert   Airway and Oxygen Therapy: Patient Spontanous Breathing  Post-op Pain: mild  Post-op Assessment: Post-op Vital signs reviewed, Patient's Cardiovascular Status Stable, Respiratory Function Stable, Patent Airway and No signs of Nausea or vomiting  Last Vitals:  Filed Vitals:   10/18/13 1415  BP: 133/68  Pulse: 72  Temp: 36.5 C  Resp: 12    Post-op Vital Signs: stable   Complications: No apparent anesthesia complications

## 2013-10-18 NOTE — Progress Notes (Signed)
Dr. Lequita Halt made aware of patient's AP Pelvis X-ray results- to write orders.

## 2013-10-18 NOTE — Op Note (Signed)
Pre-operative diagnosis- Osteoarthritis Right hip  Post-operative diagnosis- Osteoarthritis  Right hip  Procedure-  RightTotal Hip Arthroplasty  Surgeon- Gus Rankin. Kathryn Meckel, MD  Assistant- Avel Peace, PA-C   Anesthesia  General  EBL- 300 ml   Drain Hemovac   Complication- None  Condition-PACU - hemodynamically stable.   Brief Clinical Note-  Kathryn Contreras is a 52 y.o. female with end stage arthritis of her right hip with progressively worsening pain and dysfunction. Pain occurs with activity and rest including pain at night. She has tried analgesics, protected weight bearing and rest without benefit. Pain is too severe to attempt physical therapy. Radiographs demonstrate bone on bone arthritis with subchondral cyst formation. She presents now for right THA.  Procedure in detail-   The patient is brought into the operating room and placed on the operating table. After successful administration of General  anesthesia, the patient is placed in the  Left lateral decubitus position with the  Right side up and held in place with the hip positioner. The lower extremity is isolated from the perineum with plastic drapes and time-out is performed by the surgical team. The lower extremity is then prepped and draped in the usual sterile fashion. A short posterolateral incision is made with a ten blade through the subcutaneous tissue to the level of the fascia lata which is incised in line with the skin incision. The sciatic nerve is palpated and protected and the short external rotators and capsule are isolated from the femur. The hip is then dislocated and the center of the femoral head is marked. A trial prosthesis is placed such that the trial head corresponds to the center of the patients' native femoral head. The resection level is marked on the femoral neck and the resection is made with an oscillating saw. The femoral head is removed and femoral retractors placed to gain access to the femoral  canal.      The canal finder is passed into the femoral canal and the canal is thoroughly irrigated with sterile saline to remove the fatty contents. Axial reaming is performed to 13.5  mm, proximal reaming to 18 D  and the sleeve machined to a small. A 18 D small trial sleeve is placed into the proximal femur.      The femur is then retracted anteriorly to gain acetabular exposure. Acetabular retractors are placed and the labrum and osteophytes are removed, Acetabular reaming is performed to 49  mm and a 50  mm Pinnacle acetabular shell is placed in anatomic position with excellent purchase. Additional dome screws were not needed. The permanent 32 mm neutral + 4 Marathon liner is placed into the acetabular shell.      The trial femur is then placed into the femoral canal. The size is 18 x 13  stem with a 36 + 8  neck and a 32 + 0 head with the neck version matching  the patients' native anteversion. The hip is reduced with excellent stability with full extension and full external rotation, 70 degrees flexion with 40 degrees adduction and 90 degrees internal rotation and 90 degrees of flexion with 70 degrees of internal rotation. The operative leg is placed on top of the non-operative leg and the leg lengths are found to be equal. The trials are then removed and the permanent implant of the same size is impacted into the femoral canal. The  Ceramic femoral head of the same size as the trial is placed and the hip is reduced with  the same stability parameters. The operative leg is again placed on top of the non-operative leg and the leg lengths are found to be equal.      The wound is then copiously irrigated with saline solution and the capsule and short external rotators are re-attached to the femur through drill holes with Ethibond suture. The fascia lata is closed over a hemovac drain with #1 vicryl suture and the fascia lata, gluteal muscles and subcutaneous tissues are injected with Exparel 20ml diluted with  saline 30 ml plus 20 ml of .25% Marcaine. The subcutaneous tissues are closed with #1 and2-0 vicryl and the subcuticular layer closed with running 4-0 Monocryl. The drain is hooked to suction, incision cleaned and dried, and steri-srips and a bulky sterile dressing applied. The limb is placed into a knee immobilizer and the patient is awakened and transported to recovery in stable condition.      Please note that a surgical assistant was a medical necessity for this procedure in order to perform it in a safe and expeditious manner. The assistant was necessary to provide retraction to the vital neurovascular structures and to retract and position the limb to allow for anatomic placement of the prosthetic components.  Gus Rankin Craige Patel, MD    10/18/2013, 12:18 PM

## 2013-10-18 NOTE — Anesthesia Preprocedure Evaluation (Addendum)
Anesthesia Evaluation  Patient identified by MRN, date of birth, ID band Patient awake    Reviewed: Allergy & Precautions, H&P , NPO status , Patient's Chart, lab work & pertinent test results  Airway Mallampati: II TM Distance: >3 FB Neck ROM: full    Dental no notable dental hx. (+) Teeth Intact and Dental Advisory Given   Pulmonary neg pulmonary ROS,  breath sounds clear to auscultation  Pulmonary exam normal       Cardiovascular hypertension, Pt. on medications Rhythm:regular Rate:Normal     Neuro/Psych negative neurological ROS  negative psych ROS   GI/Hepatic negative GI ROS, Neg liver ROS, GERD-  Medicated and Controlled,  Endo/Other  negative endocrine ROSMorbid obesity  Renal/GU negative Renal ROS  negative genitourinary   Musculoskeletal   Abdominal (+) + obese,   Peds  Hematology negative hematology ROS (+)   Anesthesia Other Findings   Reproductive/Obstetrics negative OB ROS                           Anesthesia Physical Anesthesia Plan  ASA: II  Anesthesia Plan: General   Post-op Pain Management:    Induction: Intravenous  Airway Management Planned: Oral ETT  Additional Equipment:   Intra-op Plan:   Post-operative Plan: Extubation in OR  Informed Consent: I have reviewed the patients History and Physical, chart, labs and discussed the procedure including the risks, benefits and alternatives for the proposed anesthesia with the patient or authorized representative who has indicated his/her understanding and acceptance.   Dental Advisory Given  Plan Discussed with: CRNA and Surgeon  Anesthesia Plan Comments:         Anesthesia Quick Evaluation

## 2013-10-18 NOTE — H&P (View-Only) (Signed)
Kathryn Contreras. Deremer  DOB: 09/28/1961  Married / Language: English / Race: Black or African American  Female   Date of Admission: 10/18/2013   Chief Complaint: Right Hip Pain   History of Present Illness  The patient is a 52 year old female who comes in for a preoperative History and Physical. The patient is scheduled for a right total hip arthroplasty to be performed by Dr. Gus Rankin. Aluisio, MD at [facility] on 10/18/2013.  The patient is a 52 year old female who presents for follow up of their hip. The patient is being followed for their right hip pain and osteoarthritis. Symptoms reported today include: pain. and report their pain level to be mild to moderate. The following medication has been used for pain control: none. The patient presents today following 3 weeks post intraarticular hip injection w/ Ramos. Note for "Follow-up Hip": Injection helped initially, but pain has started to come back the past couple of days.  The cortisone injection helped, but it is already wearing off. Pain is in her groin radiating to her knee. She was told for a long time that the problem was in her back, but then she was evaluated further with x rays of her hip showing arthritis. She was referred to [place] after that. Unfortunately, things are all getting progressively worse. The hip hurts at all times. She is having a hard time sleeping at night. It is definitely limiting what she can and cannot do. She is at a stage where she is ready to get this fixed.  They have been treated conservatively in the past for the above stated problem and despite conservative measures, they continue to have progressive pain and severe functional limitations and dysfunction. They have failed non-operative management including home exercise, medications, and injections. It is felt that they would benefit from undergoing total joint replacement. Risks and benefits of the procedure have been discussed with the patient and they elect  to proceed with surgery. There are no active contraindications to surgery such as ongoing infection or rapidly progressive neurological disease.   Problem List  Osteoarthritis, Hip (715.35)   Allergies  No Known Drug Allergies. 06/26/2013   Family History  Cancer. father  Diabetes Mellitus. mother  First Degree Relatives. reported  Hypertension. mother and father  Osteoarthritis. mother and grandmother mothers side  Father. Living. age 37  Mother. Living. age 61   Social History  Illicit drug use. no  Living situation. live with spouse  Marital status. married  Exercise. does running / walking  Current work status. working full time  Drug/Alcohol Rehab (Currently). no  Drug/Alcohol Rehab (Previously). no  Tobacco use. never smoker  Tobacco / smoke exposure. no  Never smoker  Number of flights of stairs before winded. 2-3  Pain Contract. yes  Alcohol use. current drinker; drinks beer, wine and hard liquor; only occasionally per week  Children. 4  Advance Directives. Healthcare POA  Post-Surgical Plans. Plan is for skilled rehab.   Medication History  Losartan Potassium-HCTZ (100-12.5MG  Tablet, Oral) Active.  Multivitamins ( Oral) Active.  Zegerid ( Oral) Specific dose unknown - Active.   Past Surgical History  Foot Surgery. bilateral  Hysterectomy. Date: 2001. partial (non-cancerous)  Spinal Surgery. Date: 03/2012.   Medical History  Osteoarthritis  Gastroesophageal Reflux Disease  High blood pressure  Hemorrhoids  Urinary Incontinence  Menopause   Review of Systems  General: Not Present- Chills, Fever, Night Sweats, Fatigue, Weight Gain, Weight Loss and Memory Loss.  Skin:  Not Present- Hives, Itching, Rash, Eczema and Lesions.  HEENT: Not Present- Tinnitus, Headache, Double Vision, Visual Loss, Hearing Loss and Dentures.  Respiratory: Not Present- Shortness of breath with exertion, Shortness of breath at rest, Allergies, Coughing up blood and Chronic Cough.   Cardiovascular: Not Present- Chest Pain, Racing/skipping heartbeats, Difficulty Breathing Lying Down, Murmur, Swelling and Palpitations.  Gastrointestinal: Not Present- Bloody Stool, Heartburn, Abdominal Pain, Vomiting, Nausea, Constipation, Diarrhea, Difficulty Swallowing, Jaundice and Loss of appetitie.  Female Genitourinary: Not Present- Blood in Urine, Urinary frequency, Weak urinary stream, Discharge, Flank Pain, Incontinence, Painful Urination, Urgency, Urinary Retention and Urinating at Night.  Musculoskeletal: Present- Joint Pain, Back Pain and Morning Stiffness. Not Present- Muscle Weakness, Muscle Pain, Joint Swelling and Spasms.  Neurological: Not Present- Tremor, Dizziness, Blackout spells, Paralysis, Difficulty with balance and Weakness.  Psychiatric: Not Present- Insomnia.   Vitals  Height: 66 in  Pulse: 64 (Regular) Resp.: 14 (Unlabored)  BP: 130/86 (Sitting, Right Arm, Standard)   Physical Exam  The physical exam findings are as follows:  General  Mental Status - Alert, cooperative and good historian. General Appearance - pleasant. Not in acute distress. Orientation - Oriented X3. Build & Nutrition - Well nourished and Well developed.  Head and Neck  Head - normocephalic, atraumatic .  Neck  Global Assessment - supple. no bruit auscultated on the right and no bruit auscultated on the left.  Eye  Vision - Wears corrective lenses. Pupil - Bilateral - Regular and Round.  Motion - Bilateral - EOMI.  Chest and Lung Exam  Auscultation:  Breath sounds: - clear at anterior chest wall and - clear at posterior chest wall.  Adventitious sounds: - No Adventitious sounds.  Cardiovascular  Auscultation: Rhythm - Regular rate and rhythm. Heart Sounds - S1 WNL and S2 WNL.  Murmurs & Other Heart Sounds: Auscultation of the heart reveals - No Murmurs.  Abdomen  Inspection: Contour - Generalized mild distention.  Palpation/Percussion: Tenderness - Abdomen is non-tender to palpation.  Rigidity (guarding) - Abdomen is soft.  Auscultation: Auscultation of the abdomen reveals - Bowel sounds normal.  Female Genitourinary  Not done, not pertinent to present illness  Musculoskeletal  On examination she is alert and oriented in no apparent distress. Her right hip can be flexed to 90 with no internal or external rotation. No abduction. Her knee exam is normal. The left hip is normal.   RADIOGRAPHS:  AP pelvis and lateral of the right hip reviewed from earlier this year showing severe bone on bone arthritis, almost ankylosis of the hip joint with large osteophytes.   Assessment & Plan  Osteoarthritis, Hip (715.35)   Impression: Right Hip   Note: Plan is for a Right Total Hip Replacement by Dr. Lequita Halt.   Plan is to go to Rehab. She wants to look into [facility].   PCP - Dr. Johny Blamer   The patient does not have any contraindications and will receive TXA (tranexamic acid) prior to surgery.   Signed electronically by Lauraine Rinne, III PA-C

## 2013-10-18 NOTE — Progress Notes (Signed)
Portable AP Pelvis X-ray done. 

## 2013-10-18 NOTE — Interval H&P Note (Signed)
History and Physical Interval Note:  10/18/2013 10:04 AM  Maia Plan  has presented today for surgery, with the diagnosis of Right Hip Osteoarthritis  The various methods of treatment have been discussed with the patient and family. After consideration of risks, benefits and other options for treatment, the patient has consented to  Procedure(s): RIGHT TOTAL HIP ARTHROPLASTY (Right) as a surgical intervention .  The patient's history has been reviewed, patient examined, no change in status, stable for surgery.  I have reviewed the patient's chart and labs.  Questions were answered to the patient's satisfaction.     Loanne Drilling

## 2013-10-18 NOTE — Preoperative (Signed)
Beta Blockers   Reason not to administer Beta Blockers:Not Applicable 

## 2013-10-19 ENCOUNTER — Encounter (HOSPITAL_COMMUNITY): Payer: Self-pay | Admitting: Orthopedic Surgery

## 2013-10-19 DIAGNOSIS — M9701XA Periprosthetic fracture around internal prosthetic right hip joint, initial encounter: Secondary | ICD-10-CM

## 2013-10-19 LAB — BASIC METABOLIC PANEL
CO2: 26 mEq/L (ref 19–32)
Calcium: 9.3 mg/dL (ref 8.4–10.5)
Chloride: 103 mEq/L (ref 96–112)
Creatinine, Ser: 0.86 mg/dL (ref 0.50–1.10)
GFR calc Af Amer: 88 mL/min — ABNORMAL LOW (ref >90)
Potassium: 4.3 mEq/L (ref 3.5–5.1)
Sodium: 137 mEq/L (ref 135–145)

## 2013-10-19 LAB — CBC
MCH: 29.1 pg (ref 26.0–34.0)
MCV: 86.3 fL (ref 78.0–100.0)
Platelets: 251 10*3/uL (ref 150–400)
RBC: 3.51 MIL/uL — ABNORMAL LOW (ref 3.87–5.11)
RDW: 14.5 % (ref 11.5–15.5)
WBC: 16.5 10*3/uL — ABNORMAL HIGH (ref 4.0–10.5)

## 2013-10-19 MED ORDER — OMEPRAZOLE 20 MG PO CPDR
20.0000 mg | DELAYED_RELEASE_CAPSULE | Freq: Every day | ORAL | Status: DC
  Administered 2013-10-19 – 2013-10-21 (×3): 20 mg via ORAL
  Filled 2013-10-19 (×3): qty 1

## 2013-10-19 MED ORDER — NON FORMULARY: 20.0000 mg | Freq: Every day | Status: DC

## 2013-10-19 NOTE — Progress Notes (Signed)
Physical Therapy Treatment Patient Details Name: Kathryn Contreras MRN: [id number] DOB: 1961/01/10 Today's Date: 10/19/2013 Time: 6578-4696 PT Time Calculation (min): 29 min  PT Assessment / Plan / Recommendation  History of Present Illness s/p R THA   PT Comments     Follow Up Recommendations  SNF     Does the patient have the potential to tolerate intense rehabilitation     Barriers to Discharge        Equipment Recommendations  None recommended by PT    Recommendations for Other Services OT consult  Frequency 7X/week   Progress towards PT Goals    Plan Current plan remains appropriate    Precautions / Restrictions Precautions Precautions: Posterior Hip;Fall Precaution Booklet Issued: Yes (comment) Precaution Comments: reviewed hip precautions related to ADL Restrictions Weight Bearing Restrictions: Yes RLE Weight Bearing: Touchdown weight bearing   Pertinent Vitals/Pain 4/10; premed, ice packs provided    Mobility  Bed Mobility Bed Mobility: Sit to Supine Sit to Supine: 4: Min assist;3: Mod assist Details for Bed Mobility Assistance: cues for sequence and use of L LE to self assist Transfers Transfers: Sit to Stand;Stand to Sit Sit to Stand: 4: Min assist;With upper extremity assist;From chair/3-in-1 Stand to Sit: 4: Min assist;With upper extremity assist;To chair/3-in-1 Details for Transfer Assistance: cues for LE management and use of UEs to self assist Ambulation/Gait Ambulation/Gait Assistance: 4: Min assist Ambulation Distance (Feet): 65 Feet Assistive device: Rolling walker Ambulation/Gait Assistance Details: cues for sequence, position from RW and posture Gait Pattern: Step-to pattern;Decreased step length - right;Decreased step length - left;Shuffle;Trunk flexed    Exercises     PT Diagnosis:    PT Problem List:   PT Treatment Interventions:     PT Goals (current goals can now be found in the care plan section) Acute Rehab PT Goals Patient  Stated Goal: Rehab and home to resume previous lifestyle with decreased pain PT Goal Formulation: With patient Time For Goal Achievement: 10/26/13 Potential to Achieve Goals: Good  Visit Information  Last PT Received On: 10/19/13 Assistance Needed: +1 History of Present Illness: s/p R THA    Subjective Data  Patient Stated Goal: Rehab and home to resume previous lifestyle with decreased pain   Cognition  Cognition Arousal/Alertness: Awake/alert Behavior During Therapy: WFL for tasks assessed/performed Overall Cognitive Status: Within Functional Limits for tasks assessed    Balance     End of Session PT - End of Session Equipment Utilized During Treatment: Gait belt Activity Tolerance: Patient tolerated treatment well Patient left: with call bell/phone within reach;with family/visitor present;in bed Nurse Communication: Mobility status   GP     Kathryn Contreras 10/19/2013, 4:46 PM

## 2013-10-19 NOTE — Evaluation (Signed)
Occupational Therapy Evaluation Patient Details Name: Kathryn Contreras MRN: [id number] DOB: 1961-07-10 Today's Date: 10/19/2013 Time: 1410-1430 OT Time Calculation (min): 20 min  OT Assessment / Plan / Recommendation History of present illness s/p R THA   Clinical Impression   Pt requires assist for ADL transfers/mobility and LB ADL following R THA.  Educated pt in hip precautions and early mobility.  Introduced [employer] and DME use.  Pt will need short term rehab as she does not have 24 hour care at home.  Will defer further OT to SNF.    OT Assessment  All further OT needs can be met in the next venue of care    Follow Up Recommendations  SNF    Barriers to Discharge      Equipment Recommendations  None recommended by OT    Recommendations for Other Services    Frequency       Precautions / Restrictions Precautions Precautions: Posterior Hip;Fall Precaution Booklet Issued: Yes (comment) Precaution Comments: reviewed hip precautions related to ADL Restrictions Weight Bearing Restrictions: Yes RLE Weight Bearing: Touchdown weight bearing   Pertinent Vitals/Pain R hip, did not rate, premedicated, repositioned    ADL  Eating/Feeding: Independent Where Assessed - Eating/Feeding: Chair Grooming: Wash/dry hands;Set up;Teeth care Where Assessed - Grooming: Supported sitting Upper Body Bathing: Set up Where Assessed - Upper Body Bathing: Unsupported sitting Lower Body Bathing: Maximal assistance Where Assessed - Lower Body Bathing: Supported sit to stand;Unsupported sitting Upper Body Dressing: Set up Where Assessed - Upper Body Dressing: Unsupported sitting Lower Body Dressing: +1 Total assistance Where Assessed - Lower Body Dressing: Unsupported sitting;Supported sit to stand Toilet Transfer: Simulated;Minimal assistance Toilet Transfer Method: Stand pivot Equipment Used: Rolling walker;Sock aid;Reacher;Long-handled sponge;Long-handled shoe horn;Gait  belt Transfers/Ambulation Related to ADLs: transferred with min assist ADL Comments: Pt has already purchased AE from gift shop after going to [employer].  Educated in availability of tub transfer bench if pt goes home on TDWB from SNF.    OT Diagnosis: Generalized weakness;Acute pain  OT Problem List: Decreased strength;Decreased activity tolerance;Impaired balance (sitting and/or standing);Decreased knowledge of use of DME or AE;Obesity;Pain;Decreased knowledge of precautions OT Treatment Interventions:     OT Goals(Current goals can be found in the care plan section) Acute Rehab OT Goals Patient Stated Goal: Rehab and home to resume previous lifestyle with decreased pain  Visit Information  Last OT Received On: 10/19/13 Assistance Needed: +1 History of Present Illness: s/p R THA       Prior Functioning     Home Living Family/patient expects to be discharged to:: Skilled nursing facility Living Arrangements: Spouse/significant other Prior Function Level of Independence: Independent Communication Communication: No difficulties Dominant Hand: Right         Vision/Perception Vision - History Baseline Vision: Wears glasses all the time   Cognition  Cognition Arousal/Alertness: Awake/alert Behavior During Therapy: WFL for tasks assessed/performed Overall Cognitive Status: Within Functional Limits for tasks assessed    Extremity/Trunk Assessment Upper Extremity Assessment Upper Extremity Assessment: Overall WFL for tasks assessed Lower Extremity Assessment Lower Extremity Assessment: Defer to PT evaluation Cervical / Trunk Assessment Cervical / Trunk Assessment: Normal     Mobility Bed Mobility Bed Mobility: Not assessed  Transfers Transfers: Sit to Stand;Stand to Sit Sit to Stand: 4: Min assist;With upper extremity assist;From chair/3-in-1 Stand to Sit: 4: Min assist;With upper extremity assist;To chair/3-in-1 Details for Transfer Assistance: cues for LE  management and use of UEs to self assist  Exercise    Balance     End of Session OT - End of Session Activity Tolerance: Patient tolerated treatment well Patient left: in chair;with call bell/phone within reach  GO     Evern Bio 10/19/2013, 2:50 PM [phone]

## 2013-10-19 NOTE — Progress Notes (Signed)
Clinical Social Work Department CLINICAL SOCIAL WORK PLACEMENT NOTE 10/19/2013  Patient:  [name], Kathryn Contreras  Account Number:  [id number] Admit date:  10/18/2013  Clinical Social Worker:  Cori Razor, LCSW  Date/time:  10/19/2013 04:35 PM  Clinical Social Work is seeking post-discharge placement for this patient at the following level of care:   SKILLED NURSING   (*CSW will update this form in Epic as items are completed)     Patient/family provided with [facility] Department of Clinical Social Work's list of facilities offering this level of care within the geographic area requested by the patient (or if unable, by the patient's family).  10/19/2013  Patient/family informed of their freedom to choose among providers that offer the needed level of care, that participate in Medicare, Medicaid or managed care program needed by the patient, have an available bed and are willing to accept the patient.    Patient/family informed of [facility]' ownership interest in [facility], as well as of the fact that they are under no obligation to receive care at this facility.  PASARR submitted to EDS on 10/19/2013 PASARR number received from EDS on 10/19/2013  FL2 transmitted to all facilities in geographic area requested by pt/family on  10/19/2013 FL2 transmitted to all facilities within larger geographic area on   Patient informed that his/her managed care company has contracts with or will negotiate with  certain facilities, including the following:     Patient/family informed of bed offers received:  10/19/2013 Patient chooses bed at [facility] Physician recommends and patient chooses bed at    Patient to be transferred to [facility] on   Patient to be transferred to facility by   The following physician request were entered in Epic:   Additional Comments:  Cori Razor LCSW [phone]

## 2013-10-19 NOTE — Progress Notes (Signed)
Clinical Social Work Department BRIEF PSYCHOSOCIAL ASSESSMENT 10/19/2013  Patient:  CLAUDELL, RHODY     Account Number:  [id number]     Admit date:  10/18/2013  Clinical Social Worker:  Candie Chroman  Date/Time:  10/19/2013 04:22 PM  Referred by:  Physician  Date Referred:  10/19/2013 Referred for  SNF Placement   Other Referral:   Interview type:  Patient Other interview type:    PSYCHOSOCIAL DATA Living Status:  HUSBAND Admitted from facility:   Level of care:   Primary support name:  Christie Nottingham Primary support relationship to patient:  CHILD, ADULT Degree of support available:   unclear    CURRENT CONCERNS Current Concerns  Post-Acute Placement   Other Concerns:    SOCIAL WORK ASSESSMENT / PLAN Pt is a 52 yr old female living at home prior to hospitalization. CSW met with pt to assist with d/c planning. ST Rehab will be needed following hospital d/c. CSW has initiated SNF search and has provided bed offers. Pt has accepted bed at [facility] . SNF will have an opening on SAT if pt is stable for d/c. CSW will continue to follow to assist with d/c planning needs.   Assessment/plan status:  Psychosocial Support/Ongoing Assessment of Needs Other assessment/ plan:   Information/referral to community resources:   None needed at this time.    PATIENT'S/FAMILY'S RESPONSE TO PLAN OF CARE: Pt is looking forward to having rehab at [facility].   Cori Razor LCSW [phone]

## 2013-10-19 NOTE — Care Management Note (Signed)
    Page 1 of 1   10/19/2013     3:24:18 PM   CARE MANAGEMENT NOTE 10/19/2013  Patient:  Kathryn Contreras, Kathryn Contreras   Account Number:  [id number]  Date Initiated:  10/19/2013  Documentation initiated by:  Colleen Can  Subjective/Objective Assessment:   dx rt hip osteoarthritis; total hip replacemnt     Action/Plan:   SNF rehab. Referral to CSW. CM will follow as needed.   Anticipated DC Date:  10/20/2013   Anticipated DC Plan:  SKILLED NURSING FACILITY  In-house referral  Clinical Social Worker      DC Planning Services  CM consult      Choice offered to / List presented to:             Status of service:  In process, will continue to follow Medicare Important Message given?   (If response is "NO", the following Medicare IM given date fields will be blank) Date Medicare IM given:   Date Additional Medicare IM given:    Discharge Disposition:    Per UR Regulation:  Reviewed for med. necessity/level of care/duration of stay  If discussed at Long Length of Stay Meetings, dates discussed:    Comments:

## 2013-10-19 NOTE — Progress Notes (Signed)
   Subjective: 1 Day Post-Op Procedure(s) (LRB): RIGHT TOTAL HIP ARTHROPLASTY (Right) Patient reports pain as mild.   Patient seen in rounds by Dr. Lequita Halt.  Discussed the periprosthetic femur fracture at the tip of the stem.  Will keep TDWB for now and just observe.  Patient is well, but has had some minor complaints of pain in the hip, requiring pain medications We will start therapy today.  Plan is to go Skilled nursing facility after hospital stay.  Objective: Vital signs in last 24 hours: Temp:  [97.4 F (36.3 C)-98.3 F (36.8 C)] 97.4 F (36.3 C) (10/30 0917) Pulse Rate:  [51-80] 61 (10/30 0917) Resp:  [10-18] 16 (10/30 0917) BP: (106-171)/(56-81) 106/56 mmHg (10/30 0917) SpO2:  [93 %-100 %] 100 % (10/30 0917) Weight:  [104.327 kg (230 lb)] 104.327 kg (230 lb) (10/29 1435)  Intake/Output from previous day:  Intake/Output Summary (Last 24 hours) at 10/19/13 0958 Last data filed at 10/19/13 0830  Gross per 24 hour  Intake   4180 ml  Output   2375 ml  Net   1805 ml    Intake/Output this shift: Total I/O In: 120 [P.O.:120] Out: -   Labs:  Recent Labs  10/19/13 0455  HGB 10.2*    Recent Labs  10/19/13 0455  WBC 16.5*  RBC 3.51*  HCT 30.3*  PLT 251    Recent Labs  10/19/13 0455  NA 137  K 4.3  CL 103  CO2 26  BUN 10  CREATININE 0.86  GLUCOSE 136*  CALCIUM 9.3   No results found for this basename: LABPT, INR,  in the last 72 hours  EXAM General - Patient is Alert, Appropriate and Oriented Extremity - Neurovascular intact Sensation intact distally Dorsiflexion/Plantar flexion intact Dressing - dressing C/D/I Motor Function - intact, moving foot and toes well on exam.  Hemovac pulled without difficulty.  Past Medical History  Diagnosis Date  . Hypertension     takes Hyzaar daily  . Hx of seasonal allergies   . Dizziness     occasionally   . Arthritis   . Joint pain   . Joint swelling   . Chronic pain   . GERD (gastroesophageal reflux  disease)     takes Zegerid prn  . Hemorrhoids   . Constipation     related to taking pain meds  . Urinary incontinence     Assessment/Plan: 1 Day Post-Op Procedure(s) (LRB): RIGHT TOTAL HIP ARTHROPLASTY (Right) Principal Problem:   OA (osteoarthritis) of hip Active Problems:   Periprosthetic fracture around internal prosthetic right hip joint  Estimated body mass index is 37.14 kg/(m^2) as calculated from the following:   Height as of this encounter: 5\' 6"  (1.676 m).   Weight as of this encounter: 104.327 kg (230 lb). Up with therapy Discharge to SNF - wants to look into [facility] following the hospital stay.  DVT Prophylaxis - Xarelto Touch Down Weight Bearing ONLY - Do Not Advance. D/C Knee Immobilizer Hemovac Pulled Begin Therapy Hip Preacutions  Vinicius Brockman 10/19/2013, 9:58 AM

## 2013-10-19 NOTE — Evaluation (Signed)
Physical Therapy Evaluation Patient Details Name: Kathryn Contreras MRN: [id number] DOB: June 06, 1961 Today's Date: 10/19/2013 Time: 3086-5784 PT Time Calculation (min): 35 min  PT Assessment / Plan / Recommendation History of Present Illness     Clinical Impression  Pt s/p R THR presents with decreased R LE strength/ROM, post op pain, post THP and TDWB 2* periprosthetic fx limiting functional mobility.  Pt plans ST - SNF level rehab prior to return home with limited assist    PT Assessment  Patient needs continued PT services    Follow Up Recommendations  SNF    Does the patient have the potential to tolerate intense rehabilitation      Barriers to Discharge        Equipment Recommendations  None recommended by PT    Recommendations for Other Services OT consult   Frequency 7X/week    Precautions / Restrictions Precautions Precautions: Posterior Hip;Fall Restrictions Weight Bearing Restrictions: Yes RLE Weight Bearing: Touchdown weight bearing   Pertinent Vitals/Pain 4/10; premed, ice packs provided      Mobility  Bed Mobility Bed Mobility: Supine to Sit Supine to Sit: 4: Min assist;3: Mod assist Details for Bed Mobility Assistance: cues for sequence and use of L LE to self assist Transfers Transfers: Sit to Stand;Stand to Sit Sit to Stand: 4: Min assist;3: Mod assist Stand to Sit: 4: Min assist;3: Mod assist Details for Transfer Assistance: cues for LE management and use of UEs to self assist Ambulation/Gait Ambulation/Gait Assistance: 4: Min assist Ambulation Distance (Feet): 30 Feet Assistive device: Rolling walker Ambulation/Gait Assistance Details: cues for sequence, posture, position from RW, TDWB and ER on R Gait Pattern: Step-to pattern;Decreased step length - right;Decreased step length - left;Shuffle;Trunk flexed    Exercises Total Joint Exercises Ankle Circles/Pumps: AROM;Both;Supine;15 reps Quad Sets: AROM;Both;10 reps;Supine Heel Slides:  AAROM;Supine;Right;15 reps Hip ABduction/ADduction: AAROM;Right;10 reps;Supine   PT Diagnosis: Difficulty walking  PT Problem List: Decreased strength;Decreased range of motion;Decreased activity tolerance;Decreased mobility;Decreased knowledge of use of DME;Obesity;Pain PT Treatment Interventions: DME instruction;Gait training;Stair training;Functional mobility training;Therapeutic activities;Therapeutic exercise;Patient/family education     PT Goals(Current goals can be found in the care plan section) Acute Rehab PT Goals Patient Stated Goal: Rehab and home to resume previous lifestyle with decreased pain PT Goal Formulation: With patient Time For Goal Achievement: 10/26/13 Potential to Achieve Goals: Good  Visit Information  Last PT Received On: 10/19/13 Assistance Needed: +1       Prior Functioning  Home Living Family/patient expects to be discharged to:: Skilled nursing facility Living Arrangements: Spouse/significant other Prior Function Level of Independence: Independent Communication Communication: No difficulties Dominant Hand: Right    Cognition  Cognition Arousal/Alertness: Awake/alert Behavior During Therapy: WFL for tasks assessed/performed Overall Cognitive Status: Within Functional Limits for tasks assessed    Extremity/Trunk Assessment Upper Extremity Assessment Upper Extremity Assessment: Overall WFL for tasks assessed Lower Extremity Assessment Lower Extremity Assessment: Overall WFL for tasks assessed   Balance    End of Session PT - End of Session Equipment Utilized During Treatment: Gait belt Activity Tolerance: Patient tolerated treatment well Patient left: in chair;with call bell/phone within reach;with family/visitor present Nurse Communication: Mobility status  GP     Judee Hennick 10/19/2013, 1:40 PM

## 2013-10-20 LAB — CBC
HCT: 28.6 % — ABNORMAL LOW (ref 36.0–46.0)
Hemoglobin: 9.7 g/dL — ABNORMAL LOW (ref 12.0–15.0)
MCH: 29.4 pg (ref 26.0–34.0)
MCV: 86.7 fL (ref 78.0–100.0)
Platelets: 249 10*3/uL (ref 150–400)
RBC: 3.3 MIL/uL — ABNORMAL LOW (ref 3.87–5.11)
WBC: 16.4 10*3/uL — ABNORMAL HIGH (ref 4.0–10.5)

## 2013-10-20 LAB — BASIC METABOLIC PANEL
CO2: 27 mEq/L (ref 19–32)
Chloride: 102 mEq/L (ref 96–112)
Creatinine, Ser: 0.77 mg/dL (ref 0.50–1.10)
Glucose, Bld: 130 mg/dL — ABNORMAL HIGH (ref 70–99)

## 2013-10-20 MED ORDER — METOCLOPRAMIDE HCL 5 MG PO TABS: 5.0000 mg | ORAL_TABLET | Freq: Three times a day (TID) | ORAL | Status: AC | PRN

## 2013-10-20 MED ORDER — BISACODYL 10 MG RE SUPP: 10.0000 mg | Freq: Every day | RECTAL | Status: AC | PRN

## 2013-10-20 MED ORDER — RIVAROXABAN 10 MG PO TABS: 10.0000 mg | ORAL_TABLET | Freq: Every day | ORAL | Status: AC

## 2013-10-20 MED ORDER — POLYETHYLENE GLYCOL 3350 17 G PO PACK: 17.0000 g | PACK | Freq: Every day | ORAL | Status: AC | PRN

## 2013-10-20 MED ORDER — DSS 100 MG PO CAPS: 100.0000 mg | ORAL_CAPSULE | Freq: Two times a day (BID) | ORAL | Status: AC

## 2013-10-20 MED ORDER — POLYSACCHARIDE IRON COMPLEX 150 MG PO CAPS: 150.0000 mg | ORAL_CAPSULE | Freq: Every day | ORAL | Status: AC

## 2013-10-20 MED ORDER — METHOCARBAMOL 500 MG PO TABS: 500.0000 mg | ORAL_TABLET | Freq: Four times a day (QID) | ORAL | Status: AC | PRN

## 2013-10-20 MED ORDER — OXYCODONE HCL 5 MG PO TABS: 5.0000 mg | ORAL_TABLET | ORAL | Status: DC | PRN

## 2013-10-20 MED ORDER — TRAMADOL HCL 50 MG PO TABS: 50.0000 mg | ORAL_TABLET | Freq: Four times a day (QID) | ORAL | Status: DC | PRN

## 2013-10-20 MED ORDER — ONDANSETRON HCL 4 MG PO TABS: 4.0000 mg | ORAL_TABLET | Freq: Four times a day (QID) | ORAL | Status: AC | PRN

## 2013-10-20 MED ORDER — POLYSACCHARIDE IRON COMPLEX 150 MG PO CAPS
150.0000 mg | ORAL_CAPSULE | Freq: Every day | ORAL | Status: DC
  Administered 2013-10-20 – 2013-10-21 (×2): 150 mg via ORAL
  Filled 2013-10-20 (×2): qty 1

## 2013-10-20 NOTE — Progress Notes (Signed)
   Subjective: 2 Days Post-Op Procedure(s) (LRB): RIGHT TOTAL HIP ARTHROPLASTY (Right) Patient reports pain as mild.   Patient seen in rounds for Dr. Lequita Halt. Patient is well, and has had no acute complaints or problems Patient is ready to go to [facility] tomorrow  Objective: Vital signs in last 24 hours: Temp:  [97.4 F (36.3 C)-98.7 F (37.1 C)] 98.2 F (36.8 C) (10/31 0525) Pulse Rate:  [61-89] 79 (10/31 0525) Resp:  [16-18] 16 (10/31 0800) BP: (106-136)/(56-81) 110/67 mmHg (10/31 0525) SpO2:  [97 %-100 %] 97 % (10/31 0525)  Intake/Output from previous day:  Intake/Output Summary (Last 24 hours) at 10/20/13 0859 Last data filed at 10/20/13 0300  Gross per 24 hour  Intake   1599 ml  Output   1850 ml  Net   -251 ml    Intake/Output this shift:    Labs:  Recent Labs  10/19/13 0455 10/20/13 0459  HGB 10.2* 9.7*    Recent Labs  10/19/13 0455 10/20/13 0459  WBC 16.5* 16.4*  RBC 3.51* 3.30*  HCT 30.3* 28.6*  PLT 251 249    Recent Labs  10/19/13 0455 10/20/13 0459  NA 137 137  K 4.3 3.5  CL 103 102  CO2 26 27  BUN 10 9  CREATININE 0.86 0.77  GLUCOSE 136* 130*  CALCIUM 9.3 9.8   No results found for this basename: LABPT, INR,  in the last 72 hours  EXAM: General - Patient is Alert, Appropriate and Oriented Extremity - Neurovascular intact Sensation intact distally Dorsiflexion/Plantar flexion intact No cellulitis present Incision - clean, dry, no drainage, healing Motor Function - intact, moving foot and toes well on exam.   Assessment/Plan: 2 Days Post-Op Procedure(s) (LRB): RIGHT TOTAL HIP ARTHROPLASTY (Right) Procedure(s) (LRB): RIGHT TOTAL HIP ARTHROPLASTY (Right) Past Medical History  Diagnosis Date  . Hypertension     takes Hyzaar daily  . Hx of seasonal allergies   . Dizziness     occasionally   . Arthritis   . Joint pain   . Joint swelling   . Chronic pain   . GERD (gastroesophageal reflux disease)     takes Zegerid prn    . Hemorrhoids   . Constipation     related to taking pain meds  . Urinary incontinence    Principal Problem:   OA (osteoarthritis) of hip Active Problems:   Periprosthetic fracture around internal prosthetic right hip joint  Estimated body mass index is 37.14 kg/(m^2) as calculated from the following:   Height as of this encounter: 5\' 6"  (1.676 m).   Weight as of this encounter: 104.327 kg (230 lb). Up with therapy Discharge to SNF tomorrow - [facility] Diet - Cardiac diet Follow up - in 2 weeks Activity - TDWB ONLY - DO NOT ADVACNE Disposition - Skilled nursing facility Condition Upon Discharge - Pending D/C Meds - See DC Summary DVT Prophylaxis - Xarelto  Touch Down Weight Bearing ONLY - Do Not Advance. Periprosthetic fracture around internal prosthetic right hip joint  Kathryn Contreras 10/20/2013, 8:59 AM

## 2013-10-20 NOTE — Progress Notes (Signed)
Physical Therapy Treatment Patient Details Name: Kathryn Contreras MRN: [id number] DOB: 1961/12/10 Today's Date: 10/20/2013 Time: 9147-8295 PT Time Calculation (min): 18 min  PT Assessment / Plan / Recommendation  History of Present Illness s/p R THA   PT Comments     Follow Up Recommendations  SNF     Does the patient have the potential to tolerate intense rehabilitation     Barriers to Discharge        Equipment Recommendations  None recommended by PT    Recommendations for Other Services    Frequency 7X/week   Progress towards PT Goals Progress towards PT goals: Progressing toward goals  Plan Current plan remains appropriate    Precautions / Restrictions Precautions Precautions: Posterior Hip;Fall Precaution Booklet Issued: Yes (comment) Precaution Comments: reviewed hip precautions related to ADL Restrictions Weight Bearing Restrictions: Yes RLE Weight Bearing: Touchdown weight bearing       Mobility  Transfers Transfers: Sit to Stand;Stand to Sit Sit to Stand: 4: Min assist;With upper extremity assist;From chair/3-in-1 Stand to Sit: 4: Min assist;With upper extremity assist;To chair/3-in-1 Details for Transfer Assistance: cues for LE management and use of UEs to self assist Ambulation/Gait Ambulation/Gait Assistance: 4: Min assist Ambulation Distance (Feet): 55 Feet Assistive device: Rolling walker Ambulation/Gait Assistance Details: cues for stride length, position from RW and posture Gait Pattern: Step-to pattern;Decreased step length - right;Decreased step length - left;Shuffle;Trunk flexed    Exercises     PT Diagnosis:    PT Problem List:   PT Treatment Interventions:     PT Goals (current goals can now be found in the care plan section) Acute Rehab PT Goals Patient Stated Goal: Rehab and home to resume previous lifestyle with decreased pain PT Goal Formulation: With patient Time For Goal Achievement: 10/26/13 Potential to Achieve Goals:  Good  Visit Information  Last PT Received On: 10/20/13 Assistance Needed: +1 History of Present Illness: s/p R THA    Subjective Data  Patient Stated Goal: Rehab and home to resume previous lifestyle with decreased pain   Cognition  Cognition Arousal/Alertness: Awake/alert Behavior During Therapy: WFL for tasks assessed/performed Overall Cognitive Status: Within Functional Limits for tasks assessed    Balance     End of Session PT - End of Session Equipment Utilized During Treatment: Gait belt Activity Tolerance: Patient tolerated treatment well Patient left: with call bell/phone within reach;with family/visitor present;in chair Nurse Communication: Mobility status   GP     Keats Kingry 10/20/2013, 12:42 PM

## 2013-10-20 NOTE — Discharge Summary (Signed)
Physician Discharge Summary   Patient ID: Kathryn Contreras MRN: [id number] DOB/AGE: [birth year] 52 y.o.  Admit date: 10/18/2013 Discharge date: Tentative Date of Discharge - Saturday 10/20/2013  Primary Diagnosis:  Osteoarthritis Right hip  Admission Diagnoses:  Past Medical History  Diagnosis Date  . Hypertension     takes Hyzaar daily  . Hx of seasonal allergies   . Dizziness     occasionally   . Arthritis   . Joint pain   . Joint swelling   . Chronic pain   . GERD (gastroesophageal reflux disease)     takes Zegerid prn  . Hemorrhoids   . Constipation     related to taking pain meds  . Urinary incontinence    Discharge Diagnoses:   Principal Problem:   OA (osteoarthritis) of hip Active Problems:   Periprosthetic fracture around internal prosthetic right hip joint  Estimated body mass index is 37.14 kg/(m^2) as calculated from the following:   Height as of this encounter: 5\' 6"  (1.676 m).   Weight as of this encounter: 104.327 kg (230 lb).  Procedure(s) (LRB): RIGHT TOTAL HIP ARTHROPLASTY (Right)   Consults: None  HPI: Kathryn Contreras is a 52 y.o. female with end stage arthritis of her right hip with progressively worsening pain and dysfunction. Pain occurs with activity and rest including pain at night. She has tried analgesics, protected weight bearing and rest without benefit. Pain is too severe to attempt physical therapy. Radiographs demonstrate bone on bone arthritis with subchondral cyst formation. She presents now for right THA.  Laboratory Data: Admission on 10/18/2013  Component Date Value Range Status  . Transfuse no blood products 10/13/2013 TRANSFUSE NO BLOOD PRODUCTS, VERIFIED BY [facility] HENDRICKS,RN   Final  . WBC 10/19/2013 16.5* 4.0 - 10.5 K/uL Final  . RBC 10/19/2013 3.51* 3.87 - 5.11 MIL/uL Final  . Hemoglobin 10/19/2013 10.2* 12.0 - 15.0 g/dL Final  . HCT 72/53/6644 30.3* 36.0 - 46.0 % Final  . MCV 10/19/2013 86.3  78.0 - 100.0 fL Final    . MCH 10/19/2013 29.1  26.0 - 34.0 pg Final  . MCHC 10/19/2013 33.7  30.0 - 36.0 g/dL Final  . RDW 03/47/4259 14.5  11.5 - 15.5 % Final  . Platelets 10/19/2013 251  150 - 400 K/uL Final  . Sodium 10/19/2013 137  135 - 145 mEq/L Final  . Potassium 10/19/2013 4.3  3.5 - 5.1 mEq/L Final  . Chloride 10/19/2013 103  96 - 112 mEq/L Final  . CO2 10/19/2013 26  19 - 32 mEq/L Final  . Glucose, Bld 10/19/2013 136* 70 - 99 mg/dL Final  . BUN 56/38/7564 10  6 - 23 mg/dL Final  . Creatinine, Ser 10/19/2013 0.86  0.50 - 1.10 mg/dL Final  . Calcium 33/29/5188 9.3  8.4 - 10.5 mg/dL Final  . GFR calc non Af Amer 10/19/2013 76* >90 mL/min Final  . GFR calc Af Amer 10/19/2013 88* >90 mL/min Final   Comment: (NOTE)                          The eGFR has been calculated using the CKD EPI equation.                          This calculation has not been validated in all clinical situations.  eGFR's persistently <90 mL/min signify possible Chronic Kidney                          Disease.  . WBC 10/20/2013 16.4* 4.0 - 10.5 K/uL Final  . RBC 10/20/2013 3.30* 3.87 - 5.11 MIL/uL Final  . Hemoglobin 10/20/2013 9.7* 12.0 - 15.0 g/dL Final  . HCT 19/14/7829 28.6* 36.0 - 46.0 % Final  . MCV 10/20/2013 86.7  78.0 - 100.0 fL Final  . MCH 10/20/2013 29.4  26.0 - 34.0 pg Final  . MCHC 10/20/2013 33.9  30.0 - 36.0 g/dL Final  . RDW 56/21/3086 14.8  11.5 - 15.5 % Final  . Platelets 10/20/2013 249  150 - 400 K/uL Final  . Sodium 10/20/2013 137  135 - 145 mEq/L Final  . Potassium 10/20/2013 3.5  3.5 - 5.1 mEq/L Final   DELTA CHECK NOTED  . Chloride 10/20/2013 102  96 - 112 mEq/L Final  . CO2 10/20/2013 27  19 - 32 mEq/L Final  . Glucose, Bld 10/20/2013 130* 70 - 99 mg/dL Final  . BUN 57/84/6962 9  6 - 23 mg/dL Final  . Creatinine, Ser 10/20/2013 0.77  0.50 - 1.10 mg/dL Final  . Calcium 95/28/4132 9.8  8.4 - 10.5 mg/dL Final  . GFR calc non Af Amer 10/20/2013 >90  >90 mL/min Final  . GFR calc  Af Amer 10/20/2013 >90  >90 mL/min Final   Comment: (NOTE)                          The eGFR has been calculated using the CKD EPI equation.                          This calculation has not been validated in all clinical situations.                          eGFR's persistently <90 mL/min signify possible Chronic Kidney                          Disease.  Hospital Outpatient Visit on 10/13/2013  Component Date Value Range Status  . aPTT 10/13/2013 38* 24 - 37 seconds Final   Comment:                                 IF BASELINE aPTT IS ELEVATED,                          SUGGEST PATIENT RISK ASSESSMENT                          BE USED TO DETERMINE APPROPRIATE                          ANTICOAGULANT THERAPY.  . WBC 10/13/2013 9.0  4.0 - 10.5 K/uL Final  . RBC 10/13/2013 3.94  3.87 - 5.11 MIL/uL Final  . Hemoglobin 10/13/2013 11.5* 12.0 - 15.0 g/dL Final  . HCT 44/12/270 34.0* 36.0 - 46.0 % Final  . MCV 10/13/2013 86.3  78.0 - 100.0 fL Final  . MCH 10/13/2013 29.2  26.0 - 34.0 pg Final  . MCHC 10/13/2013 33.8  30.0 - 36.0 g/dL Final  . RDW 21/30/8657 14.3  11.5 - 15.5 % Final  . Platelets 10/13/2013 320  150 - 400 K/uL Final  . Sodium 10/13/2013 138  135 - 145 mEq/L Final  . Potassium 10/13/2013 3.6  3.5 - 5.1 mEq/L Final  . Chloride 10/13/2013 100  96 - 112 mEq/L Final  . CO2 10/13/2013 31  19 - 32 mEq/L Final  . Glucose, Bld 10/13/2013 89  70 - 99 mg/dL Final  . BUN 84/69/6295 8  6 - 23 mg/dL Final  . Creatinine, Ser 10/13/2013 0.88  0.50 - 1.10 mg/dL Final  . Calcium 28/41/3244 10.0  8.4 - 10.5 mg/dL Final  . Total Protein 10/13/2013 7.1  6.0 - 8.3 g/dL Final  . Albumin 12/23/7251 3.8  3.5 - 5.2 g/dL Final  . AST 66/44/0347 21  0 - 37 U/L Final  . ALT 10/13/2013 29  0 - 35 U/L Final  . Alkaline Phosphatase 10/13/2013 83  39 - 117 U/L Final  . Total Bilirubin 10/13/2013 0.4  0.3 - 1.2 mg/dL Final  . GFR calc non Af Amer 10/13/2013 74* >90 mL/min Final  . GFR calc Af Amer 10/13/2013  86* >90 mL/min Final   Comment: (NOTE)                          The eGFR has been calculated using the CKD EPI equation.                          This calculation has not been validated in all clinical situations.                          eGFR's persistently <90 mL/min signify possible Chronic Kidney                          Disease.  Marland Kitchen Prothrombin Time 10/13/2013 13.3  11.6 - 15.2 seconds Final  . INR 10/13/2013 1.03  0.00 - 1.49 Final  . Color, Urine 10/13/2013 YELLOW  YELLOW Final  . APPearance 10/13/2013 CLEAR  CLEAR Final  . Specific Gravity, Urine 10/13/2013 1.008  1.005 - 1.030 Final  . pH 10/13/2013 6.5  5.0 - 8.0 Final  . Glucose, UA 10/13/2013 NEGATIVE  NEGATIVE mg/dL Final  . Hgb urine dipstick 10/13/2013 NEGATIVE  NEGATIVE Final  . Bilirubin Urine 10/13/2013 NEGATIVE  NEGATIVE Final  . Ketones, ur 10/13/2013 NEGATIVE  NEGATIVE mg/dL Final  . Protein, ur 42/59/5638 NEGATIVE  NEGATIVE mg/dL Final  . Urobilinogen, UA 10/13/2013 0.2  0.0 - 1.0 mg/dL Final  . Nitrite 75/64/3329 NEGATIVE  NEGATIVE Final  . Leukocytes, UA 10/13/2013 NEGATIVE  NEGATIVE Final   MICROSCOPIC NOT DONE ON URINES WITH NEGATIVE PROTEIN, BLOOD, LEUKOCYTES, NITRITE, OR GLUCOSE <1000 mg/dL.  Marland Kitchen MRSA, PCR 10/13/2013 NEGATIVE  NEGATIVE Final  . Staphylococcus aureus 10/13/2013 NEGATIVE  NEGATIVE Final   Comment:                                 The Xpert SA Assay (FDA                          approved for NASAL specimens  in patients over 27 years of age),                          is one component of                          a comprehensive surveillance                          program.  Test performance has                          been validated by [employer] for patients greater                          than or equal to 57 year old.                          It is not intended                          to diagnose infection nor to                           guide or monitor treatment.     X-Rays:Dg Chest 2 View  10/13/2013   CLINICAL DATA:  Preoperative evaluation.  EXAM: CHEST  2 VIEW  COMPARISON:  04/06/2012  FINDINGS: The heart size and mediastinal contours are within normal limits. Both lungs are clear. The visualized skeletal structures are unremarkable.  IMPRESSION: No active cardiopulmonary disease.   Electronically Signed   By: Alcide Clever M.D.   On: 10/13/2013 15:29   Dg Hip Complete Right  10/13/2013   CLINICAL DATA:  Right hip pain, preoperative evaluation  EXAM: RIGHT HIP - COMPLETE 2+ VIEW  COMPARISON:  07/19/2012  FINDINGS: Osteoarthritic changes are again identified with remodeling of the acetabulum and femoral head. No acute fracture or dislocation is seen. No gross soft tissue abnormality is noted.  IMPRESSION: Worsening osteoarthritic changes.   Electronically Signed   By: Alcide Clever M.D.   On: 10/13/2013 15:28   Dg Pelvis Portable  10/18/2013   CLINICAL DATA:  Postop right hip arthroplasty.  EXAM: PORTABLE PELVIS  COMPARISON:  07/19/2012.  FINDINGS: Interval right hip arthroplasty. An obliquely oriented lucency is seen along the medial femoral shaft, adjacent to the tip of the femoral stem. Subcutaneous and joint air are present. Surgical drain is in place.  IMPRESSION: Right hip arthroplasty with findings indicative of a periprosthetic femoral shaft fracture. Study has been made a call report.   Electronically Signed   By: Leanna Battles M.D.   On: 10/18/2013 14:31   Dg Hip Portable 1 View Right  10/18/2013   CLINICAL DATA:  Right total hip arthroplasty.  EXAM: PORTABLE RIGHT HIP - 1 VIEW  COMPARISON:  10/13/2013.  FINDINGS: Lateral film demonstrates well-positioned femoral and acetabular components without complicating features.  IMPRESSION: Well seated components of a total right hip arthroplasty without complicating features.   Electronically Signed   By: Loralie Champagne M.D.   On: 10/18/2013 15:44    EKG: Orders  placed  during the hospital encounter of 04/14/12  . EKG     Hospital Course: Patient was admitted to [facility] and taken to the OR and underwent the above state procedure without complications.  Patient tolerated the procedure well and was later transferred to the recovery room and then to the orthopaedic floor for postoperative care.  They were given PO and IV analgesics for pain control following their surgery.  They were given 24 hours of postoperative antibiotics of  Anti-infectives   Start     Dose/Rate Route Frequency Ordered Stop   10/18/13 1700  ceFAZolin (ANCEF) IVPB 2 g/50 mL premix     2 g 100 mL/hr over 30 Minutes Intravenous Every 6 hours 10/18/13 1450 10/19/13 0026   10/18/13 0730  ceFAZolin (ANCEF) IVPB 2 g/50 mL premix     2 g 100 mL/hr over 30 Minutes Intravenous On call to O.R. 10/18/13 0709 10/18/13 1055     and started on DVT prophylaxis in the form of Xarelto.   PT and OT were ordered for total hip protocol.  The patient was place on TDWB only with therapy. Discharge planning was consulted to help with postop disposition and equipment needs.  Patient had a decent night on the evening of surgery.  They started to get up OOB with therapy on day one.  Hemovac drain was pulled without difficulty.  The knee immobilizer was removed and discontinued.  Continued to work with therapy into day two.  Dressing was changed on day two and the incision was healing well.  Patient was seen in rounds on day two and was doing well.  It was felt that as long as the patient continued to improve, they would be ready to transfer the following day, Saturday 10/21/2013.  The patient will evaluated by the weekend coverage staff and will discharge if doing well.  This summary was prepared in anticipation of the the patient's transfer over the weekend to [facility].   Discharge Medications: Prior to Admission medications   Medication Sig Start Date End Date Taking? Authorizing Provider    acetaminophen (TYLENOL) 500 MG tablet Take 1,000 mg by mouth every 6 (six) hours as needed for pain.   Yes Historical Provider, MD  losartan-hydrochlorothiazide (HYZAAR) 100-12.5 MG per tablet Take 1 tablet by mouth every evening.    Yes Historical Provider, MD  Omeprazole-Sodium Bicarbonate (ZEGERID) 20-1100 MG CAPS capsule Take 1 capsule by mouth daily before breakfast.   Yes Historical Provider, MD  bisacodyl (DULCOLAX) 10 MG suppository Place 1 suppository (10 mg total) rectally daily as needed. 10/20/13   Brigit Doke, PA-C  docusate sodium 100 MG CAPS Take 100 mg by mouth 2 (two) times daily. 10/20/13   Florencia Zaccaro, PA-C  iron polysaccharides (NIFEREX) 150 MG capsule Take 1 capsule (150 mg total) by mouth daily. 10/20/13   Etheridge Geil, PA-C  methocarbamol (ROBAXIN) 500 MG tablet Take 1 tablet (500 mg total) by mouth every 6 (six) hours as needed. 10/20/13   Corona Popovich, PA-C  metoCLOPramide (REGLAN) 5 MG tablet Take 1-2 tablets (5-10 mg total) by mouth every 8 (eight) hours as needed (if ondansetron (ZOFRAN) ineffective.). 10/20/13   Jacquelyn Antony, PA-C  ondansetron (ZOFRAN) 4 MG tablet Take 1 tablet (4 mg total) by mouth every 6 (six) hours as needed for nausea. 10/20/13   Ani Deoliveira, PA-C  oxyCODONE (OXY IR/ROXICODONE) 5 MG immediate release tablet Take 1-2 tablets (5-10 mg total) by mouth every 3 (three) hours as needed. 10/20/13  Reign Bartnick, PA-C  polyethylene glycol (MIRALAX / GLYCOLAX) packet Take 17 g by mouth daily as needed. 10/20/13   Jaslene Marsteller Julien Girt, PA-C  rivaroxaban (XARELTO) 10 MG TABS tablet Take 1 tablet (10 mg total) by mouth daily with breakfast. Take Xarelto for two and a half more weeks, then discontinue Xarelto. Once the patient has completed the blood thinner regimen, then take a Baby 81 mg Aspirin daily for four more weeks. 10/20/13   Tameka Hoiland, PA-C  traMADol (ULTRAM) 50 MG tablet Take 1-2 tablets  (50-100 mg total) by mouth every 6 (six) hours as needed (mild pain). 10/20/13   Lizzete Gough, PA-C     No bending hip over 90 degrees- A "L" Angle Do not cross legs Do not let foot roll inward When turning these patients a pillow should be placed between the patient's legs to prevent crossing. Patients should have the affected knee fully extended when trying to sit or stand from all surfaces to prevent excessive hip flexion. When ambulating and turning toward the affected side the affected leg should have the toes turned out prior to moving the walker and the rest of patient's body as to prevent internal rotation/ turning in of the leg. Abduction pillows are the most effective way to prevent a patient from not crossing legs or turning toes in at rest. If an abduction pillow is not ordered placing a regular pillow length wise between the patient's legs is also an effective reminder. It is imperative that these precautions be maintained so that the surgical hip does not dislocate. Diet - Cardiac diet  Follow up - in 2 weeks  Activity - TDWB ONLY - DO NOT ADVACNE  Disposition - Skilled nursing facility  Condition Upon Discharge - Pending  D/C Meds - See DC Summary  DVT Prophylaxis - Xarelto  Touch Down Weight Bearing ONLY - Do Not Advance.  Periprosthetic fracture around internal prosthetic right hip joint       Discharge Orders   Future Orders Complete By Expires   Call MD / Call 911  As directed    Comments:     If you experience chest pain or shortness of breath, CALL 911 and be transported to the hospital emergency room.  If you develope a fever above 101 F, pus (white drainage) or increased drainage or redness at the wound, or calf pain, call your surgeon's office.   Change dressing  As directed    Comments:     You may change your dressing dressing daily with sterile 4 x 4 inch gauze dressing and paper tape.  Do not submerge the incision under water.   Constipation  Prevention  As directed    Comments:     Drink plenty of fluids.  Prune juice may be helpful.  You may use a stool softener, such as Colace (over the counter) 100 mg twice a day.  Use MiraLax (over the counter) for constipation as needed.   Diet - low sodium heart healthy  As directed    Discharge instructions  As directed    Comments:     Pick up stool softner and laxative for home. Do not submerge incision under water. May shower. Continue to use ice for pain and swelling from surgery. Hip precautions.  Total Hip Protocol.  Take Xarelto for two and a half more weeks, then discontinue Xarelto. Once the patient has completed the blood thinner regimen, then take a Baby 81 mg Aspirin daily for four more weeks.  When  discharged from the skilled rehab facility, please have the facility set up the patient's Home Health Physical Therapy prior to being released.  Also provide the patient with their medications at time of release from the facility to include their pain medication, the muscle relaxants, and their blood thinner medication.  If the patient is still at the rehab facility at time of follow up appointment, please also assist the patient in arranging follow up appointment in our office and any transportation needs.  Touch Down Weight Bearing Only - Do Not Advance   Do not sit on low chairs, stoools or toilet seats, as it may be difficult to get up from low surfaces  As directed    Driving restrictions  As directed    Comments:     No driving until released by the physician.   Follow the hip precautions as taught in Physical Therapy  As directed    Increase activity slowly as tolerated  As directed    Lifting restrictions  As directed    Comments:     No lifting until released by the physician.   Patient may shower  As directed    Comments:     You may shower without a dressing once there is no drainage.  Do not wash over the wound.  If drainage remains, do not shower until drainage  stops.   TED hose  As directed    Comments:     Use stockings (TED hose) for 3 weeks on both leg(s).  You may remove them at night for sleeping.   Touch down weight bearing  As directed    Scheduling Instructions:     Do Not Advance   Questions:     Laterality:  right   Extremity:  Lower       Medication List    STOP taking these medications       cholecalciferol 1000 UNITS tablet  Commonly known as:  VITAMIN D     Fish Oil-Flax Oil-Borage Oil Caps     HYDROcodone-acetaminophen 5-325 MG per tablet  Commonly known as:  NORCO/VICODIN     multivitamin with minerals Tabs tablet     vitamin E 400 UNIT capsule      TAKE these medications       acetaminophen 500 MG tablet  Commonly known as:  TYLENOL  Take 1,000 mg by mouth every 6 (six) hours as needed for pain.     bisacodyl 10 MG suppository  Commonly known as:  DULCOLAX  Place 1 suppository (10 mg total) rectally daily as needed.     DSS 100 MG Caps  Take 100 mg by mouth 2 (two) times daily.     iron polysaccharides 150 MG capsule  Commonly known as:  NIFEREX  Take 1 capsule (150 mg total) by mouth daily.     losartan-hydrochlorothiazide 100-12.5 MG per tablet  Commonly known as:  HYZAAR  Take 1 tablet by mouth every evening.     methocarbamol 500 MG tablet  Commonly known as:  ROBAXIN  Take 1 tablet (500 mg total) by mouth every 6 (six) hours as needed.     metoCLOPramide 5 MG tablet  Commonly known as:  REGLAN  Take 1-2 tablets (5-10 mg total) by mouth every 8 (eight) hours as needed (if ondansetron (ZOFRAN) ineffective.).     Omeprazole-Sodium Bicarbonate 20-1100 MG Caps capsule  Commonly known as:  ZEGERID  Take 1 capsule by mouth daily before breakfast.     ondansetron  4 MG tablet  Commonly known as:  ZOFRAN  Take 1 tablet (4 mg total) by mouth every 6 (six) hours as needed for nausea.     oxyCODONE 5 MG immediate release tablet  Commonly known as:  Oxy IR/ROXICODONE  Take 1-2 tablets (5-10 mg  total) by mouth every 3 (three) hours as needed.     polyethylene glycol packet  Commonly known as:  MIRALAX / GLYCOLAX  Take 17 g by mouth daily as needed.     rivaroxaban 10 MG Tabs tablet  Commonly known as:  XARELTO  - Take 1 tablet (10 mg total) by mouth daily with breakfast. Take Xarelto for two and a half more weeks, then discontinue Xarelto.  - Once the patient has completed the blood thinner regimen, then take a Baby 81 mg Aspirin daily for four more weeks.     traMADol 50 MG tablet  Commonly known as:  ULTRAM  Take 1-2 tablets (50-100 mg total) by mouth every 6 (six) hours as needed (mild pain).       Follow-up Information   Follow up with Loanne Drilling, MD. Schedule an appointment as soon as possible for a visit in 2 weeks.   Specialty:  Orthopedic Surgery   Contact information:   [address] [place] [address] [phone]       Signed: Patrica Duel 10/20/2013, 9:22 AM

## 2013-10-20 NOTE — Progress Notes (Signed)
Physical Therapy Treatment Patient Details Name: Kathryn Contreras MRN: [id number] DOB: 08/08/1961 Today's Date: 10/20/2013 Time: 7829-5621 PT Time Calculation (min): 31 min  PT Assessment / Plan / Recommendation  History of Present Illness s/p R THA   PT Comments     Follow Up Recommendations  SNF     Does the patient have the potential to tolerate intense rehabilitation     Barriers to Discharge        Equipment Recommendations  None recommended by PT    Recommendations for Other Services OT consult  Frequency 7X/week   Progress towards PT Goals Progress towards PT goals: Progressing toward goals  Plan Current plan remains appropriate    Precautions / Restrictions Precautions Precautions: Posterior Hip;Fall Precaution Comments: reviewed hip precautions related to ADL Restrictions Weight Bearing Restrictions: Yes RLE Weight Bearing: Touchdown weight bearing   Pertinent Vitals/Pain 6/10; premed, ice pack provided    Mobility  Bed Mobility Bed Mobility: Sit to Supine Sit to Supine: 4: Min assist;3: Mod assist Details for Bed Mobility Assistance: cues for sequence and use of L LE to self assist Transfers Transfers: Sit to Stand;Stand to Sit Sit to Stand: 4: Min assist;With upper extremity assist;From chair/3-in-1 Stand to Sit: 4: Min assist;With upper extremity assist;To bed;To chair/3-in-1 Details for Transfer Assistance: cues for LE management and use of UEs to self assist Ambulation/Gait Ambulation/Gait Assistance: 4: Min assist Ambulation Distance (Feet): 31 Feet (and 5) Assistive device: Rolling walker Ambulation/Gait Assistance Details: min cues for posture and position from RW Gait Pattern: Step-to pattern;Decreased step length - right;Decreased step length - left;Shuffle;Trunk flexed    Exercises Total Joint Exercises Ankle Circles/Pumps: AROM;Both;Supine;15 reps Quad Sets: AROM;Both;10 reps;Supine Gluteal Sets: AROM;10 reps;Supine;Both Heel Slides:  AAROM;Supine;Right;20 reps Hip ABduction/ADduction: AAROM;Right;Supine;20 reps   PT Diagnosis:    PT Problem List:   PT Treatment Interventions:     PT Goals (current goals can now be found in the care plan section) Acute Rehab PT Goals Patient Stated Goal: Rehab and home to resume previous lifestyle with decreased pain PT Goal Formulation: With patient Time For Goal Achievement: 10/26/13 Potential to Achieve Goals: Good  Visit Information  Last PT Received On: 10/20/13 Assistance Needed: +1 History of Present Illness: s/p R THA    Subjective Data  Patient Stated Goal: Rehab and home to resume previous lifestyle with decreased pain   Cognition  Cognition Arousal/Alertness: Awake/alert Behavior During Therapy: WFL for tasks assessed/performed Overall Cognitive Status: Within Functional Limits for tasks assessed    Balance     End of Session PT - End of Session Activity Tolerance: Patient tolerated treatment well Patient left: in bed;with call bell/phone within reach;with family/visitor present Nurse Communication: Mobility status   GP     Kathryn Contreras 10/20/2013, 4:18 PM

## 2013-10-20 NOTE — Progress Notes (Signed)
CSW assisting with d/c planning. Pt planning to d/c to [facility] if stable for d/c. Tentative d/c summary has been sent to SNF. Weekend CSW will assist with d/c planning to SNF.  Cori Razor LCSW [phone]

## 2013-10-21 LAB — CBC
HCT: 28.6 % — ABNORMAL LOW (ref 36.0–46.0)
MCH: 29.1 pg (ref 26.0–34.0)
MCHC: 33.6 g/dL (ref 30.0–36.0)
MCV: 86.7 fL (ref 78.0–100.0)
Platelets: 241 10*3/uL (ref 150–400)
RBC: 3.3 MIL/uL — ABNORMAL LOW (ref 3.87–5.11)
RDW: 15 % (ref 11.5–15.5)
WBC: 14.2 10*3/uL — ABNORMAL HIGH (ref 4.0–10.5)

## 2013-10-21 NOTE — Progress Notes (Signed)
Report called and given to Neysa Bonito, nurse at [facility]. All questions answered to nurse's satisfaction. Ofilia Neas

## 2013-10-21 NOTE — Progress Notes (Signed)
Agree with above 

## 2013-10-21 NOTE — Plan of Care (Signed)
Problem: Phase III Progression Outcomes Goal: Ambulates Outcome: Not Applicable Date Met:  10/21/13 With use of walker

## 2013-10-21 NOTE — Progress Notes (Signed)
Pt discharged to [facility] nursing facility. Left unit on stretcher pushed by ambulance personnel. Left in good condition. Vwilliams,rn.

## 2013-10-21 NOTE — Progress Notes (Signed)
Physical Therapy Treatment Patient Details Name: Kathryn Contreras MRN: [id number] DOB: [birth year] Today's Date: 10/21/2013 Time: 4098-1191 PT Time Calculation (min): 38 min  PT Assessment / Plan / Recommendation  History of Present Illness s/p R THA   PT Comments     Follow Up Recommendations  SNF     Does the patient have the potential to tolerate intense rehabilitation     Barriers to Discharge        Equipment Recommendations  None recommended by PT    Recommendations for Other Services OT consult  Frequency 7X/week   Progress towards PT Goals Progress towards PT goals: Progressing toward goals  Plan Current plan remains appropriate    Precautions / Restrictions Precautions Precautions: Posterior Hip;Fall Precaution Booklet Issued: Yes (comment) Precaution Comments: pt recalls all THP without cues Restrictions Weight Bearing Restrictions: Yes RLE Weight Bearing: Touchdown weight bearing   Pertinent Vitals/Pain 5/10; premed, ice packs provided    Mobility  Bed Mobility Bed Mobility: Supine to Sit Supine to Sit: 4: Min assist Details for Bed Mobility Assistance: cues for sequence and use of L LE to self assist Transfers Transfers: Sit to Stand;Stand to Sit Sit to Stand: 4: Min assist;With upper extremity assist;From bed Stand to Sit: 4: Min assist;With upper extremity assist;To chair/3-in-1 Details for Transfer Assistance: cues for LE management and use of UEs to self assist Ambulation/Gait Ambulation/Gait Assistance: 4: Min guard Ambulation Distance (Feet): 58 Feet Assistive device: Rolling walker Ambulation/Gait Assistance Details: min cues for posture and position from RW Gait Pattern: Step-to pattern;Decreased step length - right;Decreased step length - left;Shuffle;Trunk flexed Gait velocity: decr    Exercises Total Joint Exercises Ankle Circles/Pumps: AROM;Both;Supine;15 reps Quad Sets: AROM;Both;10 reps;Supine Gluteal Sets: AROM;10  reps;Supine;Both Heel Slides: AAROM;Supine;Right;20 reps Hip ABduction/ADduction: AAROM;Right;Supine;20 reps Long Arc Quad: AROM;Right;10 reps;Supine   PT Diagnosis:    PT Problem List:   PT Treatment Interventions:     PT Goals (current goals can now be found in the care plan section) Acute Rehab PT Goals Patient Stated Goal: Rehab and home to resume previous lifestyle with decreased pain PT Goal Formulation: With patient Time For Goal Achievement: 10/26/13 Potential to Achieve Goals: Good  Visit Information  Last PT Received On: 10/21/13 Assistance Needed: +1 History of Present Illness: s/p R THA    Subjective Data  Patient Stated Goal: Rehab and home to resume previous lifestyle with decreased pain   Cognition  Cognition Arousal/Alertness: Awake/alert Behavior During Therapy: WFL for tasks assessed/performed Overall Cognitive Status: Within Functional Limits for tasks assessed    Balance     End of Session PT - End of Session Activity Tolerance: Patient tolerated treatment well Patient left: in chair;with call bell/phone within reach Nurse Communication: Mobility status   GP     Mignonne Afonso 10/21/2013, 11:57 AM

## 2013-10-21 NOTE — Progress Notes (Signed)
Subjective: 3 Days Post-Op Procedure(s) (LRB): RIGHT TOTAL HIP ARTHROPLASTY (Right) Patient reports pain as well controlled.  Tenderness to right hip. Progressing with PT. Tolerating PO's well. Denies CP, SOB, OR Calf pain. No C/N/V.  Objective: Vital signs in last 24 hours: Temp:  [98.2 F (36.8 C)-99.5 F (37.5 C)] 99.5 F (37.5 C) (11/01 1610) Pulse Rate:  [87-99] 87 (11/01 0613) Resp:  [16-18] 18 (11/01 0613) BP: (113-129)/(69-79) 117/79 mmHg (11/01 0613) SpO2:  [98 %] 98 % (11/01 9604)  Intake/Output from previous day: 10/31 0701 - 11/01 0700 In: 600 [P.O.:600] Out: 1100 [Urine:1100] Intake/Output this shift:     Recent Labs  10/19/13 0455 10/20/13 0459 10/21/13 0450  HGB 10.2* 9.7* 9.6*    Recent Labs  10/20/13 0459 10/21/13 0450  WBC 16.4* 14.2*  RBC 3.30* 3.30*  HCT 28.6* 28.6*  PLT 249 241    Recent Labs  10/19/13 0455 10/20/13 0459  NA 137 137  K 4.3 3.5  CL 103 102  CO2 26 27  BUN 10 9  CREATININE 0.86 0.77  GLUCOSE 136* 130*  CALCIUM 9.3 9.8   No results found for this basename: LABPT, INR,  in the last 72 hours  Alert and oriented x3. Abd. S/NT. Lungs clear. RRR. Right LE neurovascular intact. Right hip dressing D/I. Calf soft and non-tender.  Assessment/Plan: 3 Days Post-Op Procedure(s) (LRB): RIGHT TOTAL HIP ARTHROPLASTY (Right) D/C to SNF today. Follow D/C instructions. Take medications as directed. Questions encouraged and answered.  Riot Waterworth L 10/21/2013, 7:12 AM

## 2013-10-22 NOTE — Progress Notes (Signed)
Clinical Social Work Department CLINICAL SOCIAL WORK PLACEMENT NOTE 10/22/2013  Patient:  Kathryn Contreras, Kathryn Contreras  Account Number:  [id number] Admit date:  10/18/2013  Clinical Social Worker:  Cori Razor, LCSW  Date/time:  10/19/2013 04:35 PM  Clinical Social Work is seeking post-discharge placement for this patient at the following level of care:   SKILLED NURSING   (*CSW will update this form in Epic as items are completed)     Patient/family provided with [facility] Department of Clinical Social Work's list of facilities offering this level of care within the geographic area requested by the patient (or if unable, by the patient's family).  10/19/2013  Patient/family informed of their freedom to choose among providers that offer the needed level of care, that participate in Medicare, Medicaid or managed care program needed by the patient, have an available bed and are willing to accept the patient.    Patient/family informed of [facility]' ownership interest in [facility], as well as of the fact that they are under no obligation to receive care at this facility.  PASARR submitted to EDS on 10/19/2013 PASARR number received from [employer] on 10/19/2013  FL2 transmitted to all facilities in geographic area requested by pt/family on  10/19/2013 FL2 transmitted to all facilities within larger geographic area on   Patient informed that his/her managed care company has contracts with or will negotiate with  certain facilities, including the following:     Patient/family informed of bed offers received:  10/19/2013 Patient chooses bed at [facility] Physician recommends and patient chooses bed at    Patient to be transferred to [facility] on  10/21/2013 Patient to be transferred to facility by EMS  The following physician request were entered in Epic:   Additional Comments:  Cori Razor LCSW [phone]

## 2014-01-31 ENCOUNTER — Other Ambulatory Visit (HOSPITAL_COMMUNITY): Payer: Self-pay | Admitting: Family Medicine

## 2014-01-31 DIAGNOSIS — Z1231 Encounter for screening mammogram for malignant neoplasm of breast: Secondary | ICD-10-CM

## 2014-02-14 ENCOUNTER — Ambulatory Visit (HOSPITAL_COMMUNITY)
Admission: RE | Admit: 2014-02-14 | Discharge: 2014-02-14 | Disposition: A | Discharge status: Home / Self Care | Payer: 59 | Source: Ambulatory Visit | Attending: Family Medicine | Admitting: Family Medicine

## 2014-02-14 DIAGNOSIS — Z1231 Encounter for screening mammogram for malignant neoplasm of breast: Secondary | ICD-10-CM | POA: Insufficient documentation

## 2015-06-26 ENCOUNTER — Encounter: Payer: Self-pay | Admitting: Podiatry

## 2015-06-26 ENCOUNTER — Ambulatory Visit (INDEPENDENT_AMBULATORY_CARE_PROVIDER_SITE_OTHER): Payer: 59

## 2015-06-26 ENCOUNTER — Ambulatory Visit (INDEPENDENT_AMBULATORY_CARE_PROVIDER_SITE_OTHER): Payer: 59 | Admitting: Podiatry

## 2015-06-26 VITALS — BP 125/74 | HR 78 | Resp 15

## 2015-06-26 DIAGNOSIS — M722 Plantar fascial fibromatosis: Secondary | ICD-10-CM

## 2015-06-26 MED ORDER — TRIAMCINOLONE ACETONIDE 10 MG/ML IJ SUSP
10.0000 mg | Freq: Once | INTRAMUSCULAR | Status: AC
  Administered 2015-06-26: 10 mg

## 2015-06-26 NOTE — Progress Notes (Signed)
   Subjective:    Patient ID: Kathryn Contreras, female    DOB: 01-May-1961, 54 y.o.   MRN: [id number]  HPI Patient presents with foot pain in their right foot; pain in their heel. Has been going on for the past 4 months. Has been wearing a compression sock and has helped with pain a little. Messaging foot has not helped. Pt did buy a boot to wear at night, but it has not helped pt to feel better.   Review of Systems  HENT: Positive for sinus pressure and tinnitus.   Eyes: Positive for itching.  Musculoskeletal: Positive for gait problem.  Hematological: Bruises/bleeds easily.  All other systems reviewed and are negative.      Objective:   Physical Exam        Assessment & Plan:

## 2015-06-26 NOTE — Progress Notes (Signed)
Subjective:     Patient ID: Kathryn Contreras, female   DOB: 16-Dec-1961, 54 y.o.   MRN: [id number]  HPI patient presents stating she's having tremendous pain in the plantar of her right heel and she did have surgery on her left heel years ago as it never responded to conservative care. States it's been getting worse over the last 4 months   Review of Systems  All other systems reviewed and are negative.      Objective:   Physical Exam  Constitutional: She is oriented to person, place, and time.  Cardiovascular: Intact distal pulses.   Musculoskeletal: Normal range of motion.  Neurological: She is oriented to person, place, and time.  Skin: Skin is warm.  Nursing note and vitals reviewed.  neurovascular status intact with muscle strength adequate range of motion within normal limits. Patient's noted to have mild equinus has good digital perfusion and is well oriented 3. Upon palpation there is severe discomfort in the medial fascial band right at the insertional tendon into the calcaneus and on the left foot I did note that there is a small scar from previous surgery which did extremely well with fascial release     Assessment:     Acute plantar fasciitis right heel    Plan:     H&P and x-rays reviewed with patient. Today I injected the right plantar fascia 3 mg Kenalog 5 mg Xylocaine and applied fascial brace with instructions on usage. Placed on diclofenac 75 mg twice a day and gave instructions on physical therapy and reappoint in 1 week

## 2015-06-26 NOTE — Patient Instructions (Signed)

## 2015-07-03 ENCOUNTER — Ambulatory Visit (INDEPENDENT_AMBULATORY_CARE_PROVIDER_SITE_OTHER): Payer: 59 | Admitting: Podiatry

## 2015-07-03 ENCOUNTER — Encounter: Payer: Self-pay | Admitting: Podiatry

## 2015-07-03 VITALS — BP 127/74 | HR 68 | Resp 15

## 2015-07-03 DIAGNOSIS — M722 Plantar fascial fibromatosis: Secondary | ICD-10-CM | POA: Diagnosis not present

## 2015-07-03 NOTE — Progress Notes (Signed)
Subjective:     Patient ID: Kathryn Contreras, female   DOB: [birth year], 54 y.o.   MRN: [id number]  HPI patient states my heel is still killing me it did not respond and I just wanted go ahead and get it fixed. It's been hurting me for close to 6 months   Review of Systems     Objective:   Physical Exam Neurovascular status intact muscle strength adequate with continued severe discomfort medial fascial band right and a history of having to have the left one fixed with failure to respond to conservative care    Assessment:       acute plantar fasciitis of the right heel with long-term nature of problem and failure to respond to conservative care with history of having had the left one fixed successfully Plan:     Reviewed condition and discussed correction and explained to the patient surgery for endoscopic procedure. She wants this done and at this time I allowed her to read consent form reviewing all possible alternative treatments and complications. Patient wants to have this performed signs consent form understanding recovery can take 6 months to one year and just because 1 foot did so well there is no guarantee the other one will. She is dispensed air fracture walker with all instructions on usage and is scheduled for surgery in the next 3 weeks and is encouraged to call with any questions prior to procedure

## 2015-07-03 NOTE — Patient Instructions (Signed)
Pre-Operative Instructions  Congratulations, you have decided to take an important step to improving your quality of life.  You can be assured that the doctors of [facility] will be with you every step of the way.  1. Plan to be at the surgery center/hospital at least 1 (one) hour prior to your scheduled time unless otherwise directed by the surgical center/hospital staff.  You must have a responsible adult accompany you, remain during the surgery and drive you home.  Make sure you have directions to the surgical center/hospital and know how to get there on time. 2. For hospital based surgery you will need to obtain a history and physical form from your family physician within 1 month prior to the date of surgery- we will give you a form for you primary physician.  3. We make every effort to accommodate the date you request for surgery.  There are however, times where surgery dates or times have to be moved.  We will contact you as soon as possible if a change in schedule is required.   4. No Aspirin/Ibuprofen for one week before surgery.  If you are on aspirin, any non-steroidal anti-inflammatory medications (Mobic, Aleve, Ibuprofen) you should stop taking it 7 days prior to your surgery.  You make take Tylenol  For pain prior to surgery.  5. Medications- If you are taking daily heart and blood pressure medications, seizure, reflux, allergy, asthma, anxiety, pain or diabetes medications, make sure the surgery center/hospital is aware before the day of surgery so they may notify you which medications to take or avoid the day of surgery. 6. No food or drink after midnight the night before surgery unless directed otherwise by surgical center/hospital staff. 7. No alcoholic beverages 24 hours prior to surgery.  No smoking 24 hours prior to or 24 hours after surgery. 8. Wear loose pants or shorts- loose enough to fit over bandages, boots, and casts. 9. No slip on shoes, sneakers are best. 10. Bring  your boot with you to the surgery center/hospital.  Also bring crutches or a walker if your physician has prescribed it for you.  If you do not have this equipment, it will be provided for you after surgery. 11. If you have not been contracted by the surgery center/hospital by the day before your surgery, call to confirm the date and time of your surgery. 12. Leave-time from work may vary depending on the type of surgery you have.  Appropriate arrangements should be made prior to surgery with your employer. 13. Prescriptions will be provided immediately following surgery by your doctor.  Have these filled as soon as possible after surgery and take the medication as directed. 14. Remove nail polish on the operative foot. 15. Wash the night before surgery.  The night before surgery wash the foot and leg well with the antibacterial soap provided and water paying special attention to beneath the toenails and in between the toes.  Rinse thoroughly with water and dry well with a towel.  Perform this wash unless told not to do so by your physician.  Enclosed: 1 Ice pack (please put in freezer the night before surgery)   1 Hibiclens skin cleaner   Pre-op Instructions  If you have any questions regarding the instructions, do not hesitate to call our office.  [place]: [address] [place], [place] [address] [phone]  [place]: [address] [place], [place] [address] [phone]  [place]: [address]  [place], [place] [address] [phone]  Dr. Richard   Tuchman DPM, Dr. Norman Regal DPM Dr. Richard Sikora DPM, Dr. M. Todd Hyatt DPM, Dr. Kathryn Egerton DPM 

## 2015-07-16 ENCOUNTER — Telehealth: Payer: Self-pay | Admitting: *Deleted

## 2015-07-16 NOTE — Telephone Encounter (Signed)
"  I'm calling from [facility].  Calling to request clinicals to determine authorization for surgery scheduled for 07/31/2015."  What's your fax number?  "It is [phone].  My direct phone number is [phone]."  I faxed clinicals to Clydie Braun as requested.

## 2015-07-18 NOTE — Telephone Encounter (Signed)
Patient's surgery scheduled for 07/31/2015 for Endoscopic Plantar Fasciotomy was authorized by [facility].  The authorization number is [id number].    I faxed the authorization to [facility].

## 2015-07-24 ENCOUNTER — Telehealth: Payer: Self-pay | Admitting: *Deleted

## 2015-07-24 NOTE — Telephone Encounter (Signed)
I'm calling on behalf of Dr. Charlsie Merles.  He wants to know if he can reschedule your surgery from 07/31/2015 to 07/30/2015.  "What time will it be?"  Tentatively, you will be scheduled around 12N with a 11am arrival time.  The surgery center normally calls a day or 2 before and will give you the exact arrival time.  "Okay, that will be fine you can switch me to 08/09."  I called and spoke to [place] at [facility] and moved the surgery to 07/30/2015 from 07/31/2015.  "Okay, I've made the change.  The authorization number will remain the same.  The number is [id number]."  I called and left Aram Beecham a message about the change in dates.  Authorization number is the same as previously given.

## 2015-07-29 ENCOUNTER — Other Ambulatory Visit: Payer: Self-pay

## 2015-07-30 ENCOUNTER — Encounter: Payer: Self-pay | Admitting: Podiatry

## 2015-07-30 DIAGNOSIS — M722 Plantar fascial fibromatosis: Secondary | ICD-10-CM | POA: Diagnosis not present

## 2015-07-31 ENCOUNTER — Telehealth: Payer: Self-pay | Admitting: *Deleted

## 2015-07-31 NOTE — Telephone Encounter (Signed)
Courtesy call, pt states she is okay.  I encouraged pt to call with concerns, and I instructed pt to RICE, and keep boot on at all times, and if able to take OTC Ibuprofen could use as package directs in between doses of the pain medication.  Pt states understanding.

## 2015-08-01 NOTE — Progress Notes (Signed)
DOS 07/30/2015 Endoscopic release medial band right heel.

## 2015-08-05 ENCOUNTER — Ambulatory Visit (INDEPENDENT_AMBULATORY_CARE_PROVIDER_SITE_OTHER): Payer: 59 | Admitting: Podiatry

## 2015-08-05 ENCOUNTER — Encounter: Payer: Self-pay | Admitting: Podiatry

## 2015-08-05 VITALS — BP 145/81 | HR 97 | Temp 98.3°F | Resp 16

## 2015-08-05 DIAGNOSIS — M722 Plantar fascial fibromatosis: Secondary | ICD-10-CM | POA: Diagnosis not present

## 2015-08-07 NOTE — Progress Notes (Signed)
Subjective:     Patient ID: Kathryn Contreras, female   DOB: 11-09-1961, 54 y.o.   MRN: [id number]  HPI patient states she's doing very well with her right foot and is walking with minimal discomfort and no swelling   Review of Systems     Objective:   Physical Exam Neurovascular status intact negative Homans sign was noted with wound edges well coapted and the right heel medial and lateral side    Assessment:     Improving from endoscopic surgery right with wound edges well coapted and stitches intact    Plan:     Advised on continued compression immobilization and gradual increase in activity levels. Dispense Darco shoe to wear at home and reappoint to recheck in 2 weeks for suture removal or earlier if necessary

## 2015-08-19 ENCOUNTER — Encounter: Payer: Self-pay | Admitting: Podiatry

## 2015-08-19 ENCOUNTER — Ambulatory Visit (INDEPENDENT_AMBULATORY_CARE_PROVIDER_SITE_OTHER): Payer: 59 | Admitting: Podiatry

## 2015-08-19 VITALS — BP 141/80 | HR 75 | Resp 16

## 2015-08-19 DIAGNOSIS — M722 Plantar fascial fibromatosis: Secondary | ICD-10-CM

## 2015-08-20 NOTE — Progress Notes (Signed)
Subjective:     Patient ID: Kathryn Contreras, female   DOB: 09/27/1961, 54 y.o.   MRN: [id number]  HPI patient states my heel is doing very well   Review of Systems     Objective:   Physical Exam Neurovascular status intact muscle strength adequate with patient's right heel looking good with wound edges well coapted and alignment good.    Assessment:     Doing well post endoscopic surgery right    Plan:     Reapplied sterile dressing advised on continued boot usage and continue activity as needed and reappoint to recheck

## 2015-09-13 ENCOUNTER — Other Ambulatory Visit (HOSPITAL_COMMUNITY): Payer: Self-pay | Admitting: Family Medicine

## 2015-09-13 DIAGNOSIS — Z1231 Encounter for screening mammogram for malignant neoplasm of breast: Secondary | ICD-10-CM

## 2015-09-17 ENCOUNTER — Ambulatory Visit (HOSPITAL_COMMUNITY)
Admission: RE | Admit: 2015-09-17 | Discharge: 2015-09-17 | Disposition: A | Discharge status: Home / Self Care | Payer: 59 | Source: Ambulatory Visit | Attending: Family Medicine | Admitting: Family Medicine

## 2015-09-17 DIAGNOSIS — Z1231 Encounter for screening mammogram for malignant neoplasm of breast: Secondary | ICD-10-CM | POA: Insufficient documentation

## 2017-03-30 ENCOUNTER — Ambulatory Visit
Admission: RE | Admit: 2017-03-30 | Discharge: 2017-03-30 | Disposition: A | Discharge status: Home / Self Care | Payer: 59 | Source: Ambulatory Visit | Attending: Family Medicine | Admitting: Family Medicine

## 2017-03-30 ENCOUNTER — Other Ambulatory Visit: Payer: Self-pay | Admitting: Family Medicine

## 2017-03-30 DIAGNOSIS — M549 Dorsalgia, unspecified: Secondary | ICD-10-CM

## 2017-04-14 ENCOUNTER — Other Ambulatory Visit: Payer: Self-pay | Admitting: Family Medicine

## 2017-04-14 DIAGNOSIS — R0789 Other chest pain: Secondary | ICD-10-CM

## 2017-04-20 ENCOUNTER — Encounter: Payer: 59 | Attending: Family Medicine | Admitting: *Deleted

## 2017-04-20 DIAGNOSIS — Z6841 Body Mass Index (BMI) 40.0 and over, adult: Secondary | ICD-10-CM | POA: Diagnosis not present

## 2017-04-20 DIAGNOSIS — Z713 Dietary counseling and surveillance: Secondary | ICD-10-CM | POA: Insufficient documentation

## 2017-04-20 DIAGNOSIS — E119 Type 2 diabetes mellitus without complications: Secondary | ICD-10-CM | POA: Insufficient documentation

## 2017-04-20 NOTE — Progress Notes (Signed)

## 2017-04-27 ENCOUNTER — Encounter: Payer: 59 | Admitting: *Deleted

## 2017-04-27 DIAGNOSIS — E119 Type 2 diabetes mellitus without complications: Secondary | ICD-10-CM

## 2017-04-27 NOTE — Progress Notes (Signed)

## 2017-05-04 ENCOUNTER — Encounter: Payer: 59 | Admitting: *Deleted

## 2017-05-04 DIAGNOSIS — E119 Type 2 diabetes mellitus without complications: Secondary | ICD-10-CM | POA: Diagnosis not present

## 2017-05-04 NOTE — Progress Notes (Signed)
Patient was seen on 05/04/2017 for the third of a series of three diabetes self-management courses at [facility].   Janene Madeira. State the amount of activity recommended for healthy living . Describe activities suitable for individual needs . Identify ways to regularly incorporate activity into daily life . Identify barriers to activity and ways to over come these barriers  Identify diabetes medications being personally used and their primary action for lowering glucose and possible side effects . Describe role of stress on blood glucose and develop strategies to address psychosocial issues . Identify diabetes complications and ways to prevent them  Explain how to manage diabetes during illness . Evaluate success in meeting personal goal . Establish 2-3 goals that they will plan to diligently work on until they return for the  4211-month follow-up visit  Goals:   I will count my carb choices at most meals and snacks  I will be active  3 times a week  Help manage stress at work  Your patient has identified these potential barriers to change:  Time management  Your patient has identified their diabetes self-care support plan as  Family Education officer, [employer] Subscriptions On-line Resources Plan:  Attend Support Group as desired

## 2018-12-22 ENCOUNTER — Other Ambulatory Visit: Payer: Self-pay | Admitting: Family Medicine

## 2018-12-22 DIAGNOSIS — Z1231 Encounter for screening mammogram for malignant neoplasm of breast: Secondary | ICD-10-CM

## 2019-01-02 ENCOUNTER — Ambulatory Visit
Admission: RE | Admit: 2019-01-02 | Discharge: 2019-01-02 | Disposition: A | Discharge status: Home / Self Care | Payer: 59 | Source: Ambulatory Visit | Attending: Family Medicine | Admitting: Family Medicine

## 2019-01-02 DIAGNOSIS — Z1231 Encounter for screening mammogram for malignant neoplasm of breast: Secondary | ICD-10-CM

## 2020-12-30 ENCOUNTER — Other Ambulatory Visit: Payer: 59

## 2021-10-03 DIAGNOSIS — E119 Type 2 diabetes mellitus without complications: Secondary | ICD-10-CM | POA: Diagnosis not present

## 2021-10-03 DIAGNOSIS — D538 Other specified nutritional anemias: Secondary | ICD-10-CM | POA: Diagnosis not present

## 2021-10-03 DIAGNOSIS — K219 Gastro-esophageal reflux disease without esophagitis: Secondary | ICD-10-CM | POA: Diagnosis not present

## 2021-10-03 DIAGNOSIS — R0789 Other chest pain: Secondary | ICD-10-CM | POA: Diagnosis not present

## 2021-10-03 DIAGNOSIS — I10 Essential (primary) hypertension: Secondary | ICD-10-CM | POA: Diagnosis not present

## 2021-10-03 DIAGNOSIS — Z23 Encounter for immunization: Secondary | ICD-10-CM | POA: Diagnosis not present

## 2022-04-21 DIAGNOSIS — E7800 Pure hypercholesterolemia, unspecified: Secondary | ICD-10-CM | POA: Diagnosis not present

## 2022-04-21 DIAGNOSIS — K219 Gastro-esophageal reflux disease without esophagitis: Secondary | ICD-10-CM | POA: Diagnosis not present

## 2022-04-21 DIAGNOSIS — Z0000 Encounter for general adult medical examination without abnormal findings: Secondary | ICD-10-CM | POA: Diagnosis not present

## 2022-04-21 DIAGNOSIS — G4700 Insomnia, unspecified: Secondary | ICD-10-CM | POA: Diagnosis not present

## 2022-04-21 DIAGNOSIS — M5431 Sciatica, right side: Secondary | ICD-10-CM | POA: Diagnosis not present

## 2022-04-21 DIAGNOSIS — E119 Type 2 diabetes mellitus without complications: Secondary | ICD-10-CM | POA: Diagnosis not present

## 2022-04-21 DIAGNOSIS — G8929 Other chronic pain: Secondary | ICD-10-CM | POA: Diagnosis not present

## 2022-04-21 DIAGNOSIS — Z1211 Encounter for screening for malignant neoplasm of colon: Secondary | ICD-10-CM | POA: Diagnosis not present

## 2022-04-21 DIAGNOSIS — I10 Essential (primary) hypertension: Secondary | ICD-10-CM | POA: Diagnosis not present

## 2022-05-12 ENCOUNTER — Other Ambulatory Visit: Payer: Self-pay | Admitting: Family Medicine

## 2022-05-12 DIAGNOSIS — Z1231 Encounter for screening mammogram for malignant neoplasm of breast: Secondary | ICD-10-CM

## 2022-05-15 ENCOUNTER — Ambulatory Visit
Admission: RE | Admit: 2022-05-15 | Discharge: 2022-05-15 | Disposition: A | Discharge status: Home / Self Care | Payer: Medicare Other | Source: Ambulatory Visit | Attending: Family Medicine | Admitting: Family Medicine

## 2022-05-15 DIAGNOSIS — Z1231 Encounter for screening mammogram for malignant neoplasm of breast: Secondary | ICD-10-CM | POA: Diagnosis not present

## 2022-06-26 DIAGNOSIS — R350 Frequency of micturition: Secondary | ICD-10-CM | POA: Diagnosis not present

## 2022-11-06 DIAGNOSIS — E7800 Pure hypercholesterolemia, unspecified: Secondary | ICD-10-CM | POA: Diagnosis not present

## 2022-11-06 DIAGNOSIS — G8929 Other chronic pain: Secondary | ICD-10-CM | POA: Diagnosis not present

## 2022-11-06 DIAGNOSIS — E119 Type 2 diabetes mellitus without complications: Secondary | ICD-10-CM | POA: Diagnosis not present

## 2022-11-06 DIAGNOSIS — K219 Gastro-esophageal reflux disease without esophagitis: Secondary | ICD-10-CM | POA: Diagnosis not present

## 2022-11-06 DIAGNOSIS — G4700 Insomnia, unspecified: Secondary | ICD-10-CM | POA: Diagnosis not present

## 2022-11-06 DIAGNOSIS — I10 Essential (primary) hypertension: Secondary | ICD-10-CM | POA: Diagnosis not present

## 2023-02-18 DIAGNOSIS — M25551 Pain in right hip: Secondary | ICD-10-CM | POA: Diagnosis not present

## 2023-04-30 ENCOUNTER — Other Ambulatory Visit: Payer: Self-pay | Admitting: Family Medicine

## 2023-04-30 DIAGNOSIS — G8929 Other chronic pain: Secondary | ICD-10-CM | POA: Diagnosis not present

## 2023-04-30 DIAGNOSIS — Z1231 Encounter for screening mammogram for malignant neoplasm of breast: Secondary | ICD-10-CM

## 2023-04-30 DIAGNOSIS — N3281 Overactive bladder: Secondary | ICD-10-CM | POA: Diagnosis not present

## 2023-04-30 DIAGNOSIS — M545 Low back pain, unspecified: Secondary | ICD-10-CM | POA: Diagnosis not present

## 2023-04-30 DIAGNOSIS — Z1211 Encounter for screening for malignant neoplasm of colon: Secondary | ICD-10-CM | POA: Diagnosis not present

## 2023-04-30 DIAGNOSIS — L918 Other hypertrophic disorders of the skin: Secondary | ICD-10-CM | POA: Diagnosis not present

## 2023-04-30 DIAGNOSIS — G4700 Insomnia, unspecified: Secondary | ICD-10-CM | POA: Diagnosis not present

## 2023-04-30 DIAGNOSIS — E7800 Pure hypercholesterolemia, unspecified: Secondary | ICD-10-CM | POA: Diagnosis not present

## 2023-04-30 DIAGNOSIS — I10 Essential (primary) hypertension: Secondary | ICD-10-CM | POA: Diagnosis not present

## 2023-04-30 DIAGNOSIS — Z0000 Encounter for general adult medical examination without abnormal findings: Secondary | ICD-10-CM | POA: Diagnosis not present

## 2023-04-30 DIAGNOSIS — K219 Gastro-esophageal reflux disease without esophagitis: Secondary | ICD-10-CM | POA: Diagnosis not present

## 2023-04-30 DIAGNOSIS — E1165 Type 2 diabetes mellitus with hyperglycemia: Secondary | ICD-10-CM | POA: Diagnosis not present

## 2023-05-18 ENCOUNTER — Ambulatory Visit
Admission: RE | Admit: 2023-05-18 | Discharge: 2023-05-18 | Disposition: A | Discharge status: Home / Self Care | Payer: Medicare Other | Source: Ambulatory Visit | Attending: Family Medicine | Admitting: Family Medicine

## 2023-05-18 DIAGNOSIS — Z1231 Encounter for screening mammogram for malignant neoplasm of breast: Secondary | ICD-10-CM | POA: Diagnosis not present

## 2023-05-20 ENCOUNTER — Other Ambulatory Visit: Payer: Self-pay | Admitting: Family Medicine

## 2023-05-20 DIAGNOSIS — R928 Other abnormal and inconclusive findings on diagnostic imaging of breast: Secondary | ICD-10-CM

## 2023-05-28 ENCOUNTER — Ambulatory Visit
Admission: RE | Admit: 2023-05-28 | Discharge: 2023-05-28 | Disposition: A | Discharge status: Home / Self Care | Payer: Medicare Other | Source: Ambulatory Visit | Attending: Family Medicine | Admitting: Family Medicine

## 2023-05-28 DIAGNOSIS — R928 Other abnormal and inconclusive findings on diagnostic imaging of breast: Secondary | ICD-10-CM

## 2023-05-28 DIAGNOSIS — R921 Mammographic calcification found on diagnostic imaging of breast: Secondary | ICD-10-CM | POA: Diagnosis not present

## 2023-06-23 DIAGNOSIS — Z86010 Personal history of colonic polyps: Secondary | ICD-10-CM | POA: Diagnosis not present

## 2023-06-23 DIAGNOSIS — D123 Benign neoplasm of transverse colon: Secondary | ICD-10-CM | POA: Diagnosis not present

## 2023-06-23 DIAGNOSIS — K648 Other hemorrhoids: Secondary | ICD-10-CM | POA: Diagnosis not present

## 2023-06-23 DIAGNOSIS — Z09 Encounter for follow-up examination after completed treatment for conditions other than malignant neoplasm: Secondary | ICD-10-CM | POA: Diagnosis not present

## 2023-06-28 ENCOUNTER — Other Ambulatory Visit: Payer: Self-pay | Admitting: Family Medicine

## 2023-06-28 DIAGNOSIS — R921 Mammographic calcification found on diagnostic imaging of breast: Secondary | ICD-10-CM

## 2023-06-29 DIAGNOSIS — D123 Benign neoplasm of transverse colon: Secondary | ICD-10-CM | POA: Diagnosis not present

## 2023-10-24 IMAGING — MG MM DIGITAL SCREENING BILAT W/ TOMO AND CAD
6 of 10 series · 6 of 30 positions shown · non-contrast
Comparison: Previous exam(s).

ACR Breast Density Category a: The breast tissue is almost entirely
fatty.

CLINICAL DATA: Screening.

EXAM:
DIGITAL SCREENING BILATERAL MAMMOGRAM WITH TOMOSYNTHESIS AND CAD
TECHNIQUE: Bilateral screening digital craniocaudal and mediolateral oblique
mammograms were obtained. Bilateral screening digital breast
tomosynthesis was performed. The images were evaluated with
computer-aided detection.

[R MLO synth-2D]
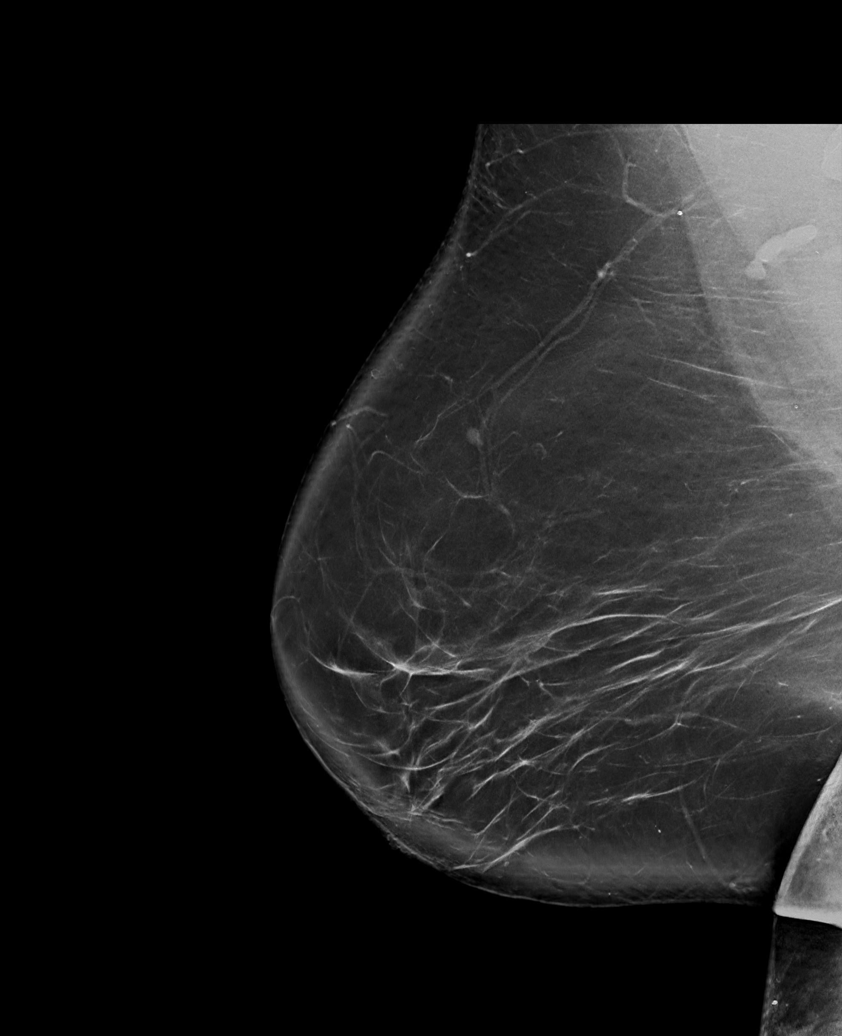

[R CC synth-2D]
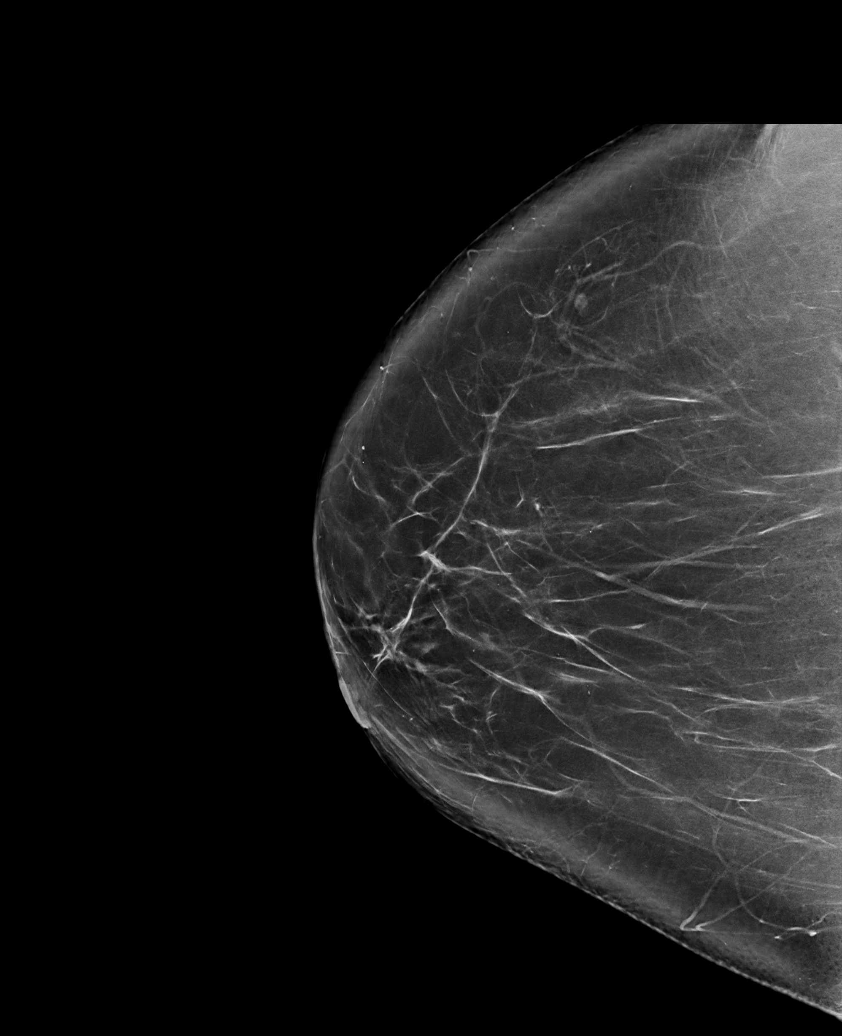

[L CC synth-2D (1 of 2)]
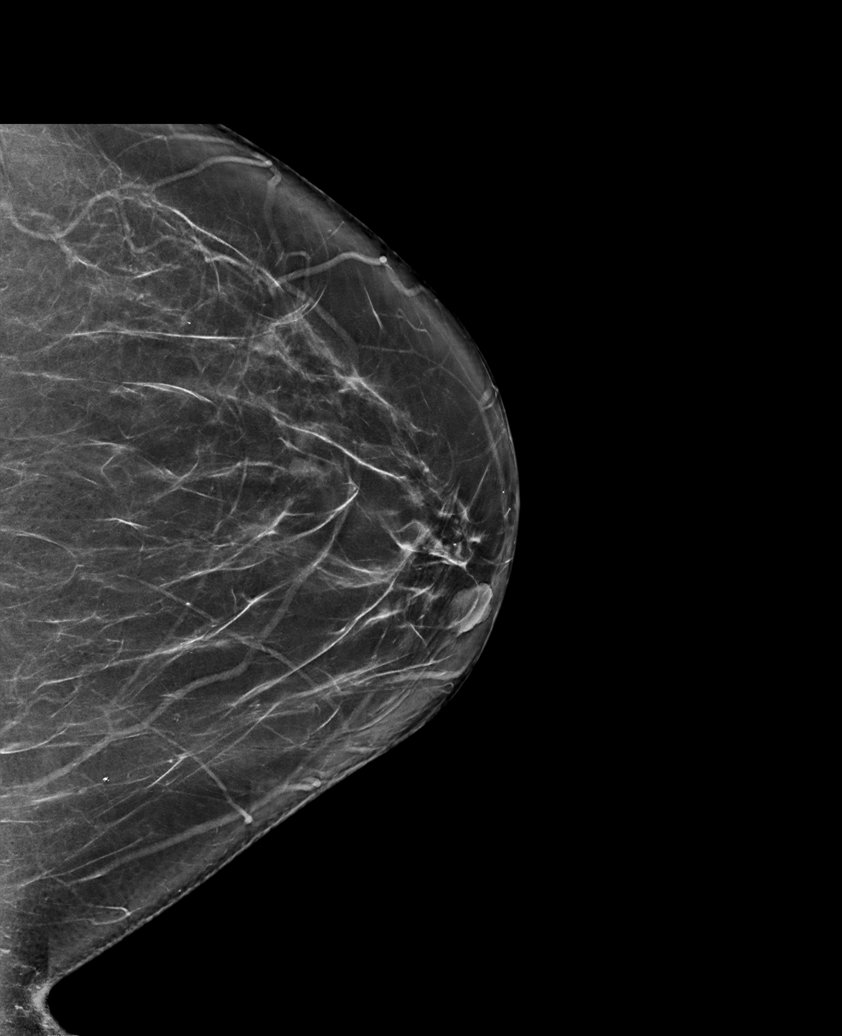

[L MLO synth-2D]
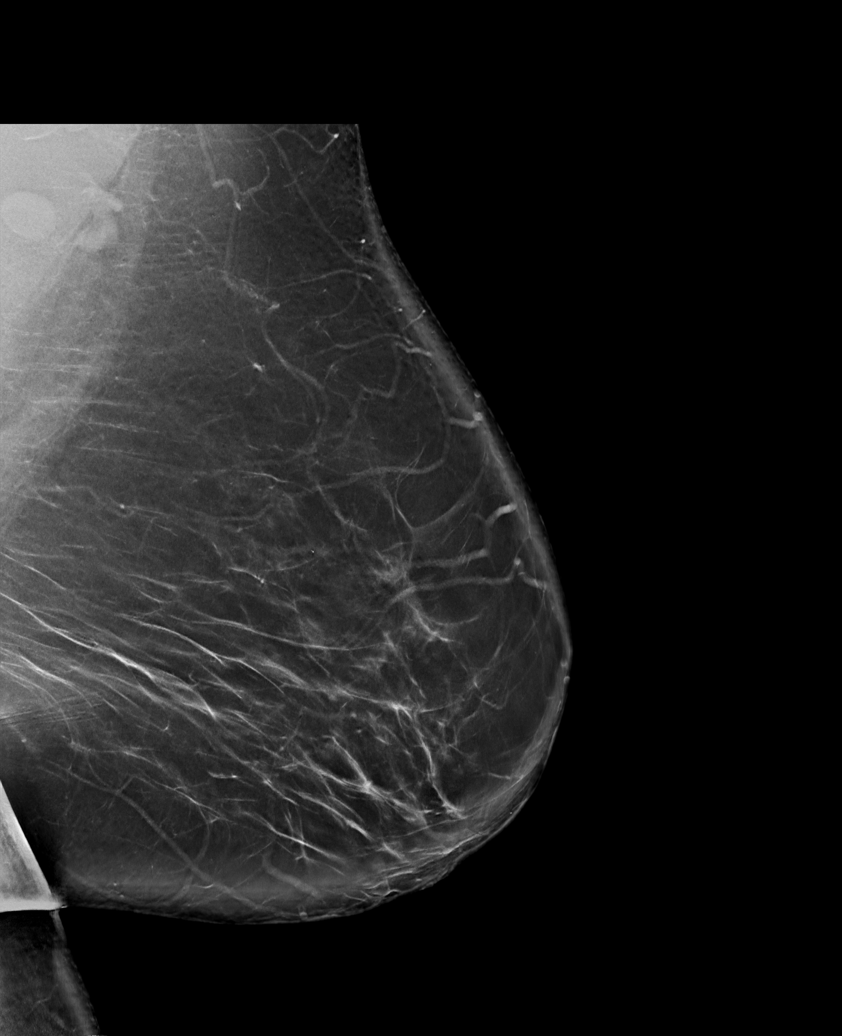

[L CC synth-2D (2 of 2)]
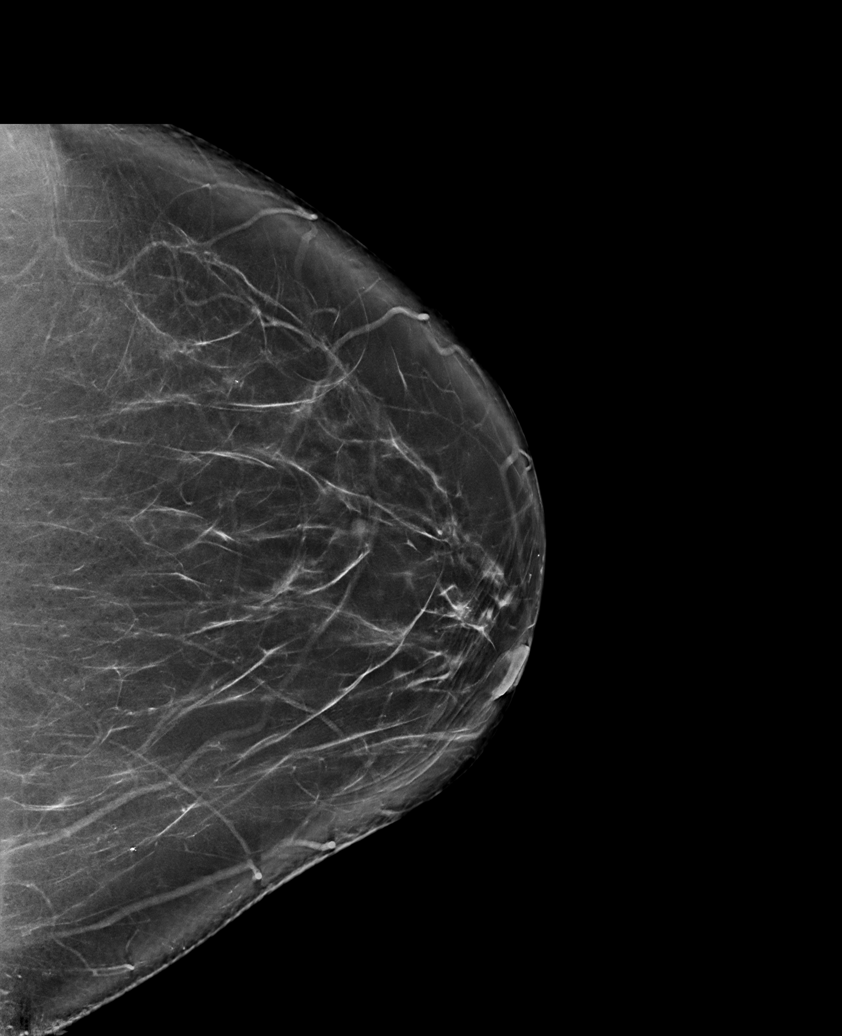

[R CC tomo · tomo slice 47/94.0]
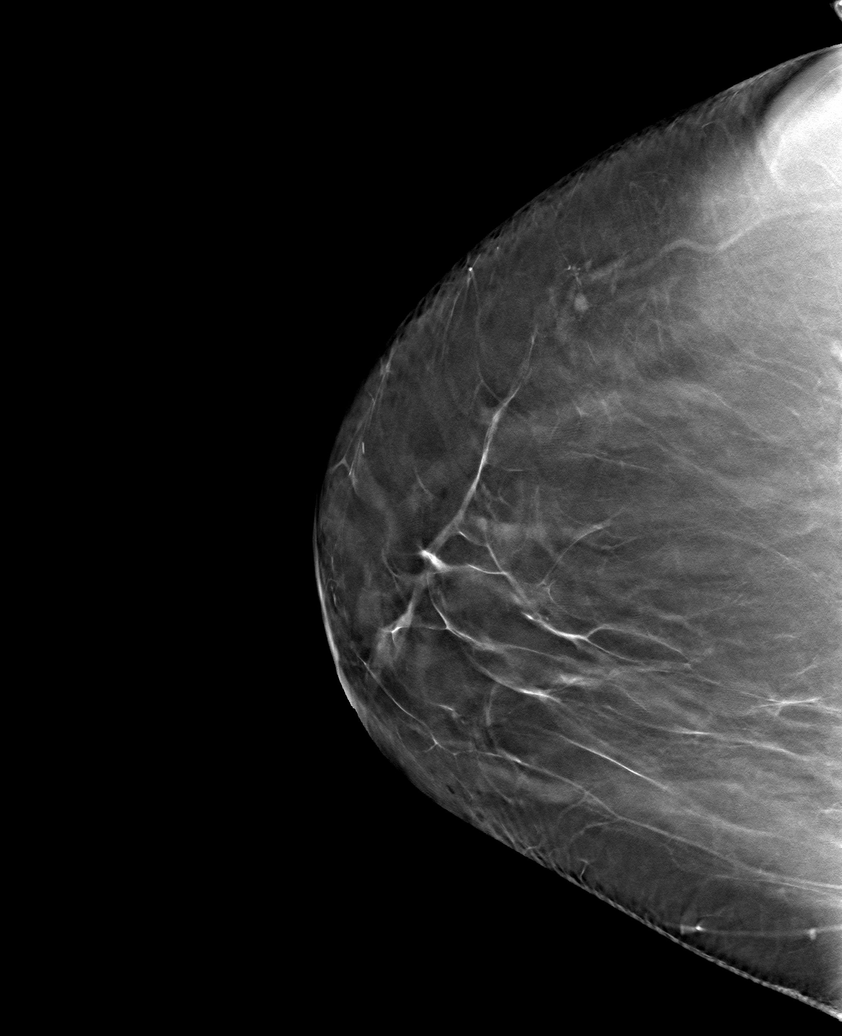

[6 of 30 positions shown; findings below may reference images not displayed]

FINDINGS: There are no findings suspicious for malignancy.
IMPRESSION: No mammographic evidence of malignancy. A result letter of this
screening mammogram will be mailed directly to the patient.

RECOMMENDATION:
Screening mammogram in one year. (Code:0E-3-N98)

BI-RADS CATEGORY  1: Negative.

## 2023-11-05 DIAGNOSIS — I10 Essential (primary) hypertension: Secondary | ICD-10-CM | POA: Diagnosis not present

## 2023-11-05 DIAGNOSIS — G4700 Insomnia, unspecified: Secondary | ICD-10-CM | POA: Diagnosis not present

## 2023-11-05 DIAGNOSIS — G8929 Other chronic pain: Secondary | ICD-10-CM | POA: Diagnosis not present

## 2023-11-05 DIAGNOSIS — Z23 Encounter for immunization: Secondary | ICD-10-CM | POA: Diagnosis not present

## 2023-11-05 DIAGNOSIS — E1165 Type 2 diabetes mellitus with hyperglycemia: Secondary | ICD-10-CM | POA: Diagnosis not present

## 2023-11-05 DIAGNOSIS — D649 Anemia, unspecified: Secondary | ICD-10-CM | POA: Diagnosis not present

## 2023-11-05 DIAGNOSIS — K219 Gastro-esophageal reflux disease without esophagitis: Secondary | ICD-10-CM | POA: Diagnosis not present

## 2023-11-05 DIAGNOSIS — J301 Allergic rhinitis due to pollen: Secondary | ICD-10-CM | POA: Diagnosis not present

## 2023-11-29 ENCOUNTER — Other Ambulatory Visit: Payer: Self-pay | Admitting: Family Medicine

## 2023-11-29 ENCOUNTER — Ambulatory Visit
Admission: RE | Admit: 2023-11-29 | Discharge: 2023-11-29 | Disposition: A | Discharge status: Home / Self Care | Payer: Medicare Other | Source: Ambulatory Visit | Attending: Family Medicine | Admitting: Family Medicine

## 2023-11-29 DIAGNOSIS — R921 Mammographic calcification found on diagnostic imaging of breast: Secondary | ICD-10-CM

## 2023-12-02 ENCOUNTER — Ambulatory Visit
Admission: RE | Admit: 2023-12-02 | Discharge: 2023-12-02 | Disposition: A | Discharge status: Home / Self Care | Payer: Medicare Other | Source: Ambulatory Visit | Attending: Family Medicine | Admitting: Family Medicine

## 2023-12-02 DIAGNOSIS — N6022 Fibroadenosis of left breast: Secondary | ICD-10-CM | POA: Diagnosis not present

## 2023-12-02 DIAGNOSIS — N6321 Unspecified lump in the left breast, upper outer quadrant: Secondary | ICD-10-CM | POA: Diagnosis not present

## 2023-12-02 DIAGNOSIS — R921 Mammographic calcification found on diagnostic imaging of breast: Secondary | ICD-10-CM

## 2023-12-02 HISTORY — PX: BREAST BIOPSY: SHX20

## 2023-12-03 LAB — SURGICAL PATHOLOGY

## 2024-04-22 DIAGNOSIS — R5383 Other fatigue: Secondary | ICD-10-CM | POA: Diagnosis not present

## 2024-04-22 DIAGNOSIS — B029 Zoster without complications: Secondary | ICD-10-CM | POA: Diagnosis not present

## 2024-05-04 ENCOUNTER — Other Ambulatory Visit: Payer: Self-pay | Admitting: Family Medicine

## 2024-05-04 DIAGNOSIS — R921 Mammographic calcification found on diagnostic imaging of breast: Secondary | ICD-10-CM

## 2024-05-12 DIAGNOSIS — I10 Essential (primary) hypertension: Secondary | ICD-10-CM | POA: Diagnosis not present

## 2024-05-12 DIAGNOSIS — L309 Dermatitis, unspecified: Secondary | ICD-10-CM | POA: Diagnosis not present

## 2024-05-12 DIAGNOSIS — Z136 Encounter for screening for cardiovascular disorders: Secondary | ICD-10-CM | POA: Diagnosis not present

## 2024-05-12 DIAGNOSIS — N3281 Overactive bladder: Secondary | ICD-10-CM | POA: Diagnosis not present

## 2024-05-12 DIAGNOSIS — G4700 Insomnia, unspecified: Secondary | ICD-10-CM | POA: Diagnosis not present

## 2024-05-12 DIAGNOSIS — J301 Allergic rhinitis due to pollen: Secondary | ICD-10-CM | POA: Diagnosis not present

## 2024-05-12 DIAGNOSIS — D649 Anemia, unspecified: Secondary | ICD-10-CM | POA: Diagnosis not present

## 2024-05-12 DIAGNOSIS — E1165 Type 2 diabetes mellitus with hyperglycemia: Secondary | ICD-10-CM | POA: Diagnosis not present

## 2024-05-12 DIAGNOSIS — K219 Gastro-esophageal reflux disease without esophagitis: Secondary | ICD-10-CM | POA: Diagnosis not present

## 2024-05-12 DIAGNOSIS — E7800 Pure hypercholesterolemia, unspecified: Secondary | ICD-10-CM | POA: Diagnosis not present

## 2024-05-12 DIAGNOSIS — Z0000 Encounter for general adult medical examination without abnormal findings: Secondary | ICD-10-CM | POA: Diagnosis not present

## 2024-05-16 ENCOUNTER — Other Ambulatory Visit (HOSPITAL_COMMUNITY): Payer: Self-pay | Admitting: Family Medicine

## 2024-05-16 DIAGNOSIS — Z136 Encounter for screening for cardiovascular disorders: Secondary | ICD-10-CM

## 2024-06-07 ENCOUNTER — Ambulatory Visit
Admission: RE | Admit: 2024-06-07 | Discharge: 2024-06-07 | Disposition: A | Discharge status: Home / Self Care | Source: Ambulatory Visit | Attending: Family Medicine | Admitting: Family Medicine

## 2024-06-07 DIAGNOSIS — R921 Mammographic calcification found on diagnostic imaging of breast: Secondary | ICD-10-CM | POA: Diagnosis not present

## 2024-06-27 ENCOUNTER — Ambulatory Visit (HOSPITAL_BASED_OUTPATIENT_CLINIC_OR_DEPARTMENT_OTHER)
Admission: RE | Admit: 2024-06-27 | Discharge: 2024-06-27 | Disposition: A | Discharge status: Home / Self Care | Payer: Self-pay | Source: Ambulatory Visit | Attending: Family Medicine | Admitting: Family Medicine

## 2024-06-27 DIAGNOSIS — Z136 Encounter for screening for cardiovascular disorders: Secondary | ICD-10-CM | POA: Insufficient documentation

## 2024-07-20 DIAGNOSIS — I10 Essential (primary) hypertension: Secondary | ICD-10-CM | POA: Diagnosis not present

## 2024-07-20 DIAGNOSIS — E1165 Type 2 diabetes mellitus with hyperglycemia: Secondary | ICD-10-CM | POA: Diagnosis not present

## 2024-07-20 DIAGNOSIS — E7800 Pure hypercholesterolemia, unspecified: Secondary | ICD-10-CM | POA: Diagnosis not present

## 2024-08-20 DIAGNOSIS — E7800 Pure hypercholesterolemia, unspecified: Secondary | ICD-10-CM | POA: Diagnosis not present

## 2024-08-20 DIAGNOSIS — E1165 Type 2 diabetes mellitus with hyperglycemia: Secondary | ICD-10-CM | POA: Diagnosis not present

## 2024-08-20 DIAGNOSIS — I10 Essential (primary) hypertension: Secondary | ICD-10-CM | POA: Diagnosis not present

## 2024-09-19 DIAGNOSIS — E7800 Pure hypercholesterolemia, unspecified: Secondary | ICD-10-CM | POA: Diagnosis not present

## 2024-09-19 DIAGNOSIS — E1165 Type 2 diabetes mellitus with hyperglycemia: Secondary | ICD-10-CM | POA: Diagnosis not present

## 2024-09-19 DIAGNOSIS — I10 Essential (primary) hypertension: Secondary | ICD-10-CM | POA: Diagnosis not present
